# Patient Record
Sex: Female | Born: 1935 | Race: White | Hispanic: No | Smoking: Never smoker
Health system: Southern US, Community
[De-identification: ages and names within clinical notes are randomized; demographics above are authoritative.]

---

## 1997-12-29 ENCOUNTER — Other Ambulatory Visit: Admission: RE | Admit: 1997-12-29 | Discharge: 1997-12-29 | Payer: Self-pay | Admitting: Family Medicine

## 2000-03-26 ENCOUNTER — Encounter: Payer: Self-pay | Admitting: Family Medicine

## 2000-03-26 ENCOUNTER — Encounter: Admission: RE | Admit: 2000-03-26 | Discharge: 2000-03-26 | Payer: Self-pay | Admitting: Family Medicine

## 2000-03-30 ENCOUNTER — Encounter: Payer: Self-pay | Admitting: Family Medicine

## 2000-03-30 ENCOUNTER — Encounter: Admission: RE | Admit: 2000-03-30 | Discharge: 2000-03-30 | Payer: Self-pay | Admitting: Family Medicine

## 2001-04-04 ENCOUNTER — Encounter: Admission: RE | Admit: 2001-04-04 | Discharge: 2001-04-04 | Payer: Self-pay | Admitting: Family Medicine

## 2001-04-04 ENCOUNTER — Encounter: Payer: Self-pay | Admitting: Family Medicine

## 2002-04-08 ENCOUNTER — Encounter: Payer: Self-pay | Admitting: Family Medicine

## 2002-04-08 ENCOUNTER — Encounter: Admission: RE | Admit: 2002-04-08 | Discharge: 2002-04-08 | Payer: Self-pay | Admitting: Family Medicine

## 2003-04-17 ENCOUNTER — Encounter: Admission: RE | Admit: 2003-04-17 | Discharge: 2003-04-17 | Payer: Self-pay | Admitting: Family Medicine

## 2003-04-17 ENCOUNTER — Encounter: Payer: Self-pay | Admitting: Family Medicine

## 2004-05-04 ENCOUNTER — Encounter: Admission: RE | Admit: 2004-05-04 | Discharge: 2004-05-04 | Payer: Self-pay | Admitting: Family Medicine

## 2005-05-16 ENCOUNTER — Encounter: Admission: RE | Admit: 2005-05-16 | Discharge: 2005-05-16 | Payer: Self-pay | Admitting: Family Medicine

## 2006-05-17 ENCOUNTER — Encounter: Admission: RE | Admit: 2006-05-17 | Discharge: 2006-05-17 | Payer: Self-pay | Admitting: Family Medicine

## 2009-12-14 ENCOUNTER — Emergency Department (HOSPITAL_COMMUNITY)
Admission: EM | Admit: 2009-12-14 | Discharge: 2009-12-14 | Payer: Self-pay | Source: Home / Self Care | Admitting: Emergency Medicine

## 2010-11-08 LAB — COMPREHENSIVE METABOLIC PANEL
ALT: 15 U/L (ref 0–35)
AST: 26 U/L (ref 0–37)
BUN: 8 mg/dL (ref 6–23)
Calcium: 10.3 mg/dL (ref 8.4–10.5)
Chloride: 103 mEq/L (ref 96–112)
Potassium: 3.9 mEq/L (ref 3.5–5.1)
Sodium: 137 mEq/L (ref 135–145)

## 2010-11-08 LAB — URINALYSIS, ROUTINE W REFLEX MICROSCOPIC
Glucose, UA: NEGATIVE mg/dL
Hgb urine dipstick: NEGATIVE
Nitrite: NEGATIVE
Protein, ur: NEGATIVE mg/dL
pH: 8 (ref 5.0–8.0)

## 2010-11-08 LAB — URINE CULTURE

## 2010-11-08 LAB — CBC
MCHC: 34.2 g/dL (ref 30.0–36.0)
MCV: 97.2 fL (ref 78.0–100.0)
RBC: 3.62 MIL/uL — ABNORMAL LOW (ref 3.87–5.11)
WBC: 11.4 10*3/uL — ABNORMAL HIGH (ref 4.0–10.5)

## 2010-11-08 LAB — URINE MICROSCOPIC-ADD ON

## 2010-11-08 LAB — DIFFERENTIAL
Lymphs Abs: 1.1 10*3/uL (ref 0.7–4.0)
Neutrophils Relative %: 85 % — ABNORMAL HIGH (ref 43–77)

## 2010-11-08 LAB — TROPONIN I: Troponin I: 0.03 ng/mL (ref 0.00–0.06)

## 2010-11-08 LAB — CK TOTAL AND CKMB (NOT AT ARMC): Total CK: 124 U/L (ref 7–177)

## 2016-06-21 ENCOUNTER — Encounter (HOSPITAL_COMMUNITY): Payer: Self-pay | Admitting: *Deleted

## 2016-06-21 DIAGNOSIS — M62838 Other muscle spasm: Secondary | ICD-10-CM | POA: Insufficient documentation

## 2016-06-21 DIAGNOSIS — I10 Essential (primary) hypertension: Secondary | ICD-10-CM | POA: Diagnosis not present

## 2016-06-21 DIAGNOSIS — M542 Cervicalgia: Secondary | ICD-10-CM | POA: Diagnosis present

## 2016-06-21 MED ORDER — OXYCODONE-ACETAMINOPHEN 5-325 MG PO TABS
1.0000 | ORAL_TABLET | ORAL | Status: DC | PRN
  Administered 2016-06-21: 1 via ORAL
  Filled 2016-06-21: qty 1

## 2016-06-21 NOTE — ED Triage Notes (Signed)
Pt complains of right neck pain radiating to down her arm  for the past 8 hours. Pt states pain is worse when trying to turn neck or lift arm.

## 2016-06-21 NOTE — ED Notes (Signed)
Pain medication given in Triage. Patient advised about side effects of medications and  to avoid driving for a minimum of 4 hours.  

## 2016-06-22 ENCOUNTER — Emergency Department (HOSPITAL_COMMUNITY)
Admission: EM | Admit: 2016-06-22 | Discharge: 2016-06-22 | Disposition: A | Discharge status: Home / Self Care | Payer: Medicare Other | Attending: Emergency Medicine | Admitting: Emergency Medicine

## 2016-06-22 ENCOUNTER — Emergency Department (HOSPITAL_COMMUNITY): Payer: Medicare Other

## 2016-06-22 DIAGNOSIS — M62838 Other muscle spasm: Secondary | ICD-10-CM

## 2016-06-22 DIAGNOSIS — M542 Cervicalgia: Secondary | ICD-10-CM

## 2016-06-22 MED ORDER — DICLOFENAC SODIUM 1 % TD GEL: 2.0000 g | Freq: Four times a day (QID) | TRANSDERMAL | 0 refills | Status: DC

## 2016-06-22 MED ORDER — TRAMADOL HCL 50 MG PO TABS: 50.0000 mg | ORAL_TABLET | Freq: Four times a day (QID) | ORAL | 0 refills | Status: DC | PRN

## 2016-06-22 MED ORDER — NAPROXEN 500 MG PO TABS
500.0000 mg | ORAL_TABLET | Freq: Once | ORAL | Status: AC
  Administered 2016-06-22: 500 mg via ORAL
  Filled 2016-06-22: qty 1

## 2016-06-22 MED ORDER — METHOCARBAMOL 1000 MG/10ML IJ SOLN: 1000.0000 mg | Freq: Once | INTRAMUSCULAR | Status: DC

## 2016-06-22 MED ORDER — METHOCARBAMOL 500 MG PO TABS
1000.0000 mg | ORAL_TABLET | Freq: Once | ORAL | Status: AC
  Administered 2016-06-22: 1000 mg via ORAL
  Filled 2016-06-22: qty 2

## 2016-06-22 NOTE — ED Provider Notes (Signed)
[facility]-EMERGENCY DEPT Provider Note   CSN: [id number] Arrival date & time: 06/21/16  2102 By signing my name below, I, Levon Hedger, attest that this documentation has been prepared under the direction and in the presence of non-physician practitioner, Arvilla Meres, PA-C  Electronically Signed: Levon Hedger, Scribe. 06/22/2016. 1:50 AM.   History   Chief Complaint Chief Complaint  Patient presents with  . Neck Pain   HPI Theresa Day is a 82 y.o. female with hx of arthritis and HTN who presents to the Emergency Department complaining of gradually worsening, right-sided neck pain. She states she was turning her head when the pain began. She describes the pain as a "soreness." Pain is exacerbated by movement. Pt denies any trauma or injury. Pt applied arthritis cream and took aspirin PTA with no relief. She was given percocet in the ED with some relief. Pt rates her pain as 10/10 in severity upon arrival at the ED, but now states it is tolerable and moderate.  No recent change in activity. No recent travel, surgeries, or hospitalizations. No hx of cancer. Pt denies any rash, difficulty swallowing, blurred vision, CP, SOB, abdominal pain, photophobia, fever, vomiting, hematuria, incontinence, dysuria, dizziness, lightheadedness, weakness, or numbness.    The history is provided by the patient. No language interpreter was used.    History reviewed. No pertinent past medical history.  There are no active problems to display for this patient.   History reviewed. No pertinent surgical history.  OB History    No data available      Home Medications    Prior to Admission medications   Medication Sig Start Date End Date Taking? Authorizing Provider  diclofenac sodium (VOLTAREN) 1 % GEL Apply 2 g topically 4 (four) times daily. 06/22/16   Lona Kettle, PA-C  traMADol (ULTRAM) 50 MG tablet Take 1 tablet (50 mg total) by mouth every 6 (six) hours as needed for severe pain. 06/22/16    Lona Kettle, PA-C   Family History No family history on file.  Social History Social History  Substance Use Topics  . Smoking status: Never Smoker  . Smokeless tobacco: Never Used  . Alcohol use No     Allergies   Review of patient's allergies indicates not on file.   Review of Systems Review of Systems  Constitutional: Negative for chills, diaphoresis and fever.  HENT: Negative for trouble swallowing.   Eyes: Negative for photophobia and visual disturbance.  Respiratory: Negative for shortness of breath.   Cardiovascular: Negative for chest pain.  Gastrointestinal: Negative for abdominal pain, blood in stool, constipation, diarrhea, nausea and vomiting.  Genitourinary: Negative for dysuria and hematuria.  Musculoskeletal: Positive for myalgias and neck pain.  Skin: Negative for rash.  Neurological: Negative for weakness, light-headedness, numbness and headaches.    Physical Exam Updated Vital Signs BP 169/78 (BP Location: Left Arm)   Pulse 77   Temp 97.8 F (36.6 C) (Oral)   Resp 18   SpO2 100%   Physical Exam  Constitutional: She appears well-developed and well-nourished. No distress.  HENT:  Head: Normocephalic and atraumatic.  Mouth/Throat: Oropharynx is clear and moist. No oropharyngeal exudate.  Eyes: Conjunctivae and EOM are normal. Pupils are equal, round, and reactive to light. Right eye exhibits no discharge. Left eye exhibits no discharge. No scleral icterus.  Neck: Normal range of motion and phonation normal. Neck supple. Spinous process tenderness and muscular tenderness present. No neck rigidity. Normal range of motion present.  Mild cervical spinal  tenderness. TTP of right trapezius.   Cardiovascular: Normal rate, regular rhythm, normal heart sounds and intact distal pulses.   No murmur heard. No JVD. No carotid bruits.  Pulmonary/Chest: Effort normal and breath sounds normal. No stridor. No respiratory distress. She has no wheezes. She has  no rales.  Abdominal: Soft. Bowel sounds are normal. She exhibits no distension. There is no tenderness. There is no rigidity, no rebound, no guarding and no CVA tenderness.  Musculoskeletal: Normal range of motion.  Lymphadenopathy:    She has no cervical adenopathy.  Neurological: She is alert. She is not disoriented. Coordination and gait normal. GCS eye subscore is 4. GCS verbal subscore is 5. GCS motor subscore is 6.  Mental Status:  Alert, thought content appropriate, able to give a coherent history. Speech fluent without evidence of aphasia. Able to follow 2 step commands without difficulty.  Cranial Nerves:  II: pupils equal, round, reactive to light III,IV, VI: ptosis not present, extra-ocular motions intact bilaterally  V,VII: smile symmetric, facial light touch sensation equal VIII: hearing grossly normal to voice  X: uvula elevates symmetrically  XI: bilateral shoulder shrug symmetric and strong XII: midline tongue extension without fassiculations Motor:  Normal tone. 5/5 in upper and lower extremities bilaterally including strong and equal grip strength and dorsiflexion/plantar flexion Sensory: light touch normal in all extremities. Cerebellar: normal finger-to-nose with bilateral upper extremities CV: distal pulses palpable throughout   Skin: Skin is warm and dry. She is not diaphoretic.  Psychiatric: She has a normal mood and affect. Her behavior is normal.   ED Treatments / Results  DIAGNOSTIC STUDIES:  Oxygen Saturation is 100% on RA, normal by my interpretation.    COORDINATION OF CARE:  1:46 AM Discussed treatment plan with pt at bedside and pt agreed to plan.  Labs (all labs ordered are listed, but only abnormal results are displayed) Labs Reviewed - No data to display  EKG  EKG Interpretation None       Radiology Dg Cervical Spine Complete  Result Date: 06/22/2016 CLINICAL DATA:  Nontraumatic right sided neck pain extending into the upper extremity  for 48 hours. EXAM: CERVICAL SPINE - COMPLETE 4+ VIEW COMPARISON:  None. FINDINGS: There is no fracture. There is no bone lesion or bony destruction. Moderately severe degenerative cervical disc disease is present, greatest at C4-5 and C5-6. There is mild degenerative retrolisthesis of C4 with respect to C3 and C5. There is osteophytic encroachment on the neural foramina bilaterally, C4 through C6. IMPRESSION: Degenerative cervical disc disease.  No acute findings are evident. Electronically Signed   By: Ellery Plunkaniel R Mitchell M.D.   On: 06/22/2016 02:59    Procedures Procedures (including critical care time)  Medications Ordered in ED Medications  oxyCODONE-acetaminophen (PERCOCET/ROXICET) 5-325 MG per tablet 1 tablet (1 tablet Oral Given 06/21/16 2128)  methocarbamol (ROBAXIN) tablet 1,000 mg (not administered)  naproxen (NAPROSYN) tablet 500 mg (500 mg Oral Given 06/22/16 0305)     Initial Impression / Assessment and Plan / ED Course  I have reviewed the triage vital signs and the nursing notes.  Pertinent labs & imaging results that were available during my care of the patient were reviewed by me and considered in my medical decision making (see chart for details).  Clinical Course  Value Comment By Time  DG Cervical Spine Complete Degenerative disc disease noted.  Lona Kettleshley Laurel Meyer, [place]C 11/02 [phone]    Patient presents to ED with complaint of right sided neck pain x 1 day. Patient  is afebrile and non-toxic appearing in NAD. Vital signs remarkable for HTN, otherwise stable. Reproducible TTP of right trapezius, mild TTP of cervical spine. Neck ROM intact. No JVD. No carotid bruit. Patient denies CP or SOB. Lungs CTABL. Heart RRR. No focal neuro deficits on exam. X-ray remarkable for cervical DDD, no fracture. Pain improved in ED with treatment. Low suspicion of cardiac etiology. Suspect MSK in nature as pain is reproducible with TTP, worse with movement.  Discussed results and plan with patient.  Symptomatic management discussed - heat/ice, gentle stretching, massage, tylenol. Rx voltaren gel and ultram for severe pain. Follow up with PCP next week. Return precautions given. Pt voiced understanding and is agreeable.   Discussed patient with Dr. Nicanor AlconPalumbo, who also saw patient, agrees with plan.   Final Clinical Impressions(s) / ED Diagnoses   Final diagnoses:  Neck pain  Trapezius muscle spasm   New Prescriptions New Prescriptions   DICLOFENAC SODIUM (VOLTAREN) 1 % GEL    Apply 2 g topically 4 (four) times daily.   TRAMADOL (ULTRAM) 50 MG TABLET    Take 1 tablet (50 mg total) by mouth every 6 (six) hours as needed for severe pain.  I personally performed the services described in this documentation, which was scribed in my presence. The recorded information has been reviewed and is accurate.    Lona Kettleshley Laurel Meyer, [place]-C 06/22/16 [phone]    April Palumbo, MD 06/22/16 [phone]

## 2016-06-22 NOTE — Discharge Instructions (Signed)
Read the information below.  Your x-ray shows arthritis in your neck. I suspect you have a tight muscle. You are being prescribed voltaren gel. You can apply to the right shoulder for symptomatic relief. You can take tylenol 650mg  every 6hrs for mild to moderate pain. I have prescribed ultram for severe pain. This can make you drowsy, take this medication with caution.  Use the prescribed medication as directed.  Please discuss all new medications with your pharmacist.   Be sure to call your primary provider and follow up on Monday for re-evaluation.  You may return to the Emergency Department at any time for worsening condition or any new symptoms that concern you. Return to ED if you develop fever, numbness, weakness, chest pain, shortness of breath, or any other new/concerning symptoms.

## 2021-06-29 NOTE — Progress Notes (Signed)
 Medicare Subsequent AWV   Theresa Day is a 87 y.o. female who presents for her subsequent annual wellness visit for Medicare.  Any physical exam components or additional concerns beyond the scope of the Annual Wellness Visit may be documented in a separate note within this encounter.  Health Risk Assessment   Current Living Arrangement: Spouse/Significant Other During the past four weeks, how much pain in your body have you had on a scale of 0-10?: No pain (0) During the past four weeks, was someone available to help you if you needed and wanted help?: No assistance needed During the past four weeks, what was the hardest level of physical activity you could do for at least two minutes?: Heavy Each night, how many hours of sleep do you usually get?: 8 Do you snore or has anyone told you that you snore?: No Do you always fasten your seat belt when you are in a car?: Yes Do you drive after drinking alcohol or ride with a driver who has been drinking?: No How often during the past four weeks have you been bothered by sexual problems?: Never Do any of the following describe you?  Multiple sex partners and/or intercourse with partner of the same sex: No How often during the past four weeks have you been bothered by teeth or denture problems?: Never How often during the past four weeks have you been bothered by tiredness or fatigue?: Never During the past four weeks, how would you rate your health in general?: Very good What is your race? (Check all that apply): White   Substance Use Disorder and Opioid Risk Screening   Any identifiable risks for substance use disorder (illegal or inappropriate use of a controlled substance)?  Please select any that are applicable.: No identifiable risks Is patient currently using an opioid agonist medication routinely or PRN?: No   Depression Screening      06/29/2021    2:05 PM 10/03/2019   10:06 AM 07/24/2018    1:32 PM  Depression Screen   Please choose the category that best describes the patient's current state 0 0 0  Not eligible on the basis of Not applicable Not applicable Not applicable  1. Little interest or pleasure in doing things 0 0 0  2. Feeling down, depressed, or hopeless 0 0 0  PHQ-2 Total Score 0 0 0  PHQ-2 Interpretation of Score for Depression (Payor) Negative Negative       Cognitive and Functional Assessments   Is the person deaf or does he/she have serious difficulty hearing?: No Is this person blind or does he/she have serious difficulty seeing even when wearing glasses?: No Do you/patient have serious difficulty concentrating, remembering, or making decisions?: No Have you had any concerns about changes in your memory or concentration?: No  ADL Assessment   Please select any of the following that the person has serious difficulty managing on their own:: none apply Please select any of the following that the person has serious difficulty managing on their own:: none apply   Social Determinants of Health (SDoH) and Personal Data   On average, how many days per week do you engage in moderate to strenuous exercise (like a brisk walk)?: 0 days On average, how many minutes do you engage in exercise at this level?: 0 min How hard is it for you to pay for the very basics like food, housing, medical care, and heating?: Not hard at all In the last 12 months, was there  a time when you were not able to pay the mortgage or rent on time?: No In the last 12 months, how many places have you lived?: 1 In the last 12 months, was there a time when you did not have a steady place to sleep or slept in a shelter (including now)?: No In the past 12 months, has lack of transportation kept you from medical appointments or from getting medications?: No In the past 12 months, has lack of transportation kept you from meetings, work, or from getting things needed for daily living?: No Within the past 12 months, you worried  that your food would run out before you got the money to buy more.: Never true Within the past 12 months, the food you bought just didn't last and you didn't have money to get more.: Never true Do you feel stress - tense, restless, nervous, or anxious, or unable to sleep at night because your mind is troubled all the time - these days?: Not at all In a typical week, how many times do you talk on the phone with family, friends, or neighbors?: More than three times a week How often do you get together with friends or relatives?: Once a week How often do you attend church or religious services?: Never Do you belong to any clubs or organizations such as church groups, unions, fraternal or athletic groups, or school groups?: No How often do you attend meetings of the clubs or organizations you belong to?: Never Are you married, widowed, divorced, separated, never married, or living with a partner?: Married Q1: How often do you have a drink containing alcohol?: 2-4 times a month Q2: How many drinks containing alcohol do you have on a typical day when you are drinking?: 1 or 2 Q3: How often do you have six or more drinks on one occasion?: Never Do you depend on people living with you for personal care?: No Are there any cultural or religious beliefs that your healthcare provider should be aware of that would be helpful in your health care? : No  Advance Care Directives   Do you have a living will?: Yes Do you have a Healthcare Power of Attorney?: (!) No  Falls Risk Screening      Patient can ambulate: Yes Do you feel unsteady when standing or walking?: No Do you worry about falling?: No Have you fallen in the past year?: No  Medicare Required Components     Reviewed and updated this visit by provider: Tobacco  Allergies  Meds  Problems  Med Hx  Surg Hx  Fam Hx        Cognitive screen indicated?: No Based on my direct observation, with due consideration of information obtained via  beneficiary reports and communication with family members/care takers, further cognitive assessment is not indicated.  Dietary issues addressed:: Yes HRA completed and reviewed:: Yes Care Team updated:: Yes Advance care directives discussed and information provided if necessary:: Yes  Patient Care Team: Rosina LITTIE Bullock, PA-C as PCP - General (Physician Assistant)  Vitals    Vitals:   06/29/21 1355 06/29/21 1405  BP: (!) 177/85 140/68  Patient Position: Sitting   Pulse: 74   Temp: 97.3 F (36.3 C)   TempSrc: Temporal   Resp: 18   Height: 5' 1 (1.549 m)   Weight: 108 lb 12.8 oz (49.4 kg)   SpO2: 95%   BMI (Calculated): 20.6   PainSc: 0-No pain     Disposition   1. Encounter  for annual wellness exam in Medicare patient (Primary) 2. Encounter for wellness examination -     CBC And Differential; Future -     Comprehensive Metabolic Panel; Future -     Vitamin D 25 Hydroxy; Future -     Lipid Panel With LDL/HDL Ratio; Future -     TSH; Future 3. Essential hypertension -     CBC And Differential; Future 4. Mixed hyperlipidemia -     Lipid Panel With LDL/HDL Ratio; Future 5. Vitamin D deficiency -     Vitamin D 25 Hydroxy; Future 6. Needs flu shot 7. Need for vaccination -     Fluad Adjuvanted Quad (age 58 and older) 0.56mL - prefilled syringe    No follow-ups on file.   Health maintenance issues including appropriate cancer screening, annual eye exam, healthy diet, exercise and tobacco avoidance were discussed with the patient.  A written plan was provided to the patient in the form of patient instructions in the after visit summary.

## 2021-07-01 NOTE — Progress Notes (Signed)
 Subjective   Theresa Day is a 87 y.o. female who presents for an annual exam.    Health Maintenance: Last wellness visit:  more than 1 year ago Diet:  general Calcium supplementation:  never Vitamin D supplementation:  never Exercise frequency:  never Exercise type:  no regular exercise Pap: patient does not recall results of last pap Mammogram:  was normal DEXA:  Unknown Colonoscopy:  Yes  Patient Active Problem List  Diagnosis  . Vertigo  . Essential hypertension  . Mixed hyperlipidemia  . Vitamin D deficiency   Outpatient Medications Marked as Taking for the 06/29/21 encounter (Medicare AWV) with Tinnie Kohl, FNP  Medication Sig Dispense Refill  . lisinopril (PRINIVIL,ZESTRIL) 40 mg tablet TAKE ONE TABLET (40 MG DOSE) BY MOUTH DAILY. 90 tablet 3   Allergies Patient is allergic to lipitor [atorvastatin], zetia [ezetimibe], zocor [simvastatin], and pravachol.  Review of Systems - All other systems were reviewed and are negative unless stated in HPI.  Family History  Problem Relation Age of Onset  . Other Other        Family history of No Significant Family History  . Diabetes Daughter   . No Known Problems Mother   . No Known Problems Father    Past Medical History:  Diagnosis Date  . Cataract    bilateral  . Essential hypertension, benign    History of Benign Essential Hypertension  . Essential hypertension, benign    History of Benign Essential Hypertension  . Hyperlipidemia   . Vertigo    Past Surgical History:  Procedure Laterality Date  . Eye surgery     cataracts bilateral  . Tubal ligation     Pediatric History  Patient Parents  . Not on file   Other Topics Concern  . Not on file  Social History Narrative   Marital History - Currently Married   Never Drank Alcohol    has a past surgical history that includes Eye surgery and Tubal ligation.  Objective   BP 140/68 (BP Location: Left arm)   Pulse 74   Temp 97.3 F (36.3 C)  (Temporal)   Resp 18   Ht 5' 1 (1.549 m)   Wt 108 lb 12.8 oz (49.4 kg)   SpO2 95%   BMI 20.56 kg/m   General: The patient is a 87 y.o. female who appears to be in no acute distress. Psych: She is alert and oriented to person, place, and time. Her mood and affect are normal. HEENT: Normocephalic, atraumatic, non-icteric sclera, PERRL.  Tympanic membranes are without perforation or infection.  Nasopharynx is grossly normal.  Oropharynx is without mass or exudate. Neck:  Supple.  Trachea is in midline position.  The neck is without adenopathy, masses, or thyromegaly. Lungs:  Good breath sounds are noted bilaterally.  The lungs are clear to auscultation and percussion bilaterally.  The spine and CVA region are  nontender to palpation. Cardio:  Regular rate and rhythm without gallop, rub, or murmur.  Abdomen:  Bowel sounds are physiologic.  The abdomen is soft and nontender to palpation.  No masses are noted.  No hepatosplenomegaly is noted.  Skin:  No rashes or worrisome lesions are noted.  Extremities:  The extremities are without cyanosis or significant contusions.  Pitting edema is not noted.  ROM is normal in all four extremities. Pulses: Adequate pulses are noted in all four extremities and both carotid arteries. Neuro:  Mental status is normal.  CN 2-11 are grossly normal.  Motor and  sensory exams are grossly normal.  DTR are physiologic in all extremities.  Gait is stable. Breast Exam:  Deferred Pelvic:  Deferred   Impression     ICD-10-CM   1. Encounter for annual wellness exam in Medicare patient  Z00.00     2. Encounter for wellness examination  Z00.00 CBC And Differential    Comprehensive Metabolic Panel    Vitamin D 25 Hydroxy    Lipid Panel With LDL/HDL Ratio    TSH    TSH    Lipid Panel With LDL/HDL Ratio    Vitamin D 25 Hydroxy    Comprehensive Metabolic Panel    CBC And Differential    3. Essential hypertension  I10 CBC And Differential    CBC And Differential     4. Mixed hyperlipidemia  E78.2 Lipid Panel With LDL/HDL Ratio    Lipid Panel With LDL/HDL Ratio    5. Vitamin D deficiency  E55.9 Vitamin D 25 Hydroxy    Vitamin D 25 Hydroxy    6. Need for vaccination  Z23 Fluad Adjuvanted Quad (age 21 and older) 0.49mL - prefilled syringe      Plan    Orders Placed This Encounter  Procedures  . Fluad Adjuvanted Quad (age 22 and older) 0.5mL - prefilled syringe  . CBC And Differential  . Comprehensive Metabolic Panel  . Vitamin D 25 Hydroxy  . Lipid Panel With LDL/HDL Ratio  . TSH    - Health maintenance issues including appropriate cancer screening, healthy diet, exercise and tobacco avoidance were discussed with the patient.  I've encouraged healthy lifestyle modifications of eating fruits/vegetables, decreased fat intake, regular daily exercise, and decrease stress.  - Risks, benefits, and alternatives of the medications and treatment plan prescribed today were discussed, and patient expressed understanding.  - Pap smear and breast exam are done at her ob/gyn.  - Labs ordered today: cbc/d,cmp,tsh,vitamin d,lipid panel.  We will call pt with results. - No follow-ups on file. - Return to clinic to be reevaluated if symptoms worsen, persist, change, or if you have any other concerns. - I discussed this diagnosis with the patient and discussed the treatment plan with them. This treatment plan is also outlined in the Patient Instructions and a copy of this was provided to the patient.

## 2021-08-18 ENCOUNTER — Encounter (HOSPITAL_BASED_OUTPATIENT_CLINIC_OR_DEPARTMENT_OTHER): Payer: Self-pay | Admitting: *Deleted

## 2021-08-18 ENCOUNTER — Emergency Department (HOSPITAL_BASED_OUTPATIENT_CLINIC_OR_DEPARTMENT_OTHER): Payer: Medicare Other

## 2021-08-18 ENCOUNTER — Other Ambulatory Visit: Payer: Self-pay

## 2021-08-18 ENCOUNTER — Emergency Department (HOSPITAL_BASED_OUTPATIENT_CLINIC_OR_DEPARTMENT_OTHER)
Admission: EM | Admit: 2021-08-18 | Discharge: 2021-08-18 | Disposition: A | Discharge status: Home / Self Care | Payer: Medicare Other | Attending: Emergency Medicine | Admitting: Emergency Medicine

## 2021-08-18 DIAGNOSIS — M545 Low back pain, unspecified: Secondary | ICD-10-CM | POA: Diagnosis not present

## 2021-08-18 DIAGNOSIS — S52502A Unspecified fracture of the lower end of left radius, initial encounter for closed fracture: Secondary | ICD-10-CM | POA: Diagnosis not present

## 2021-08-18 DIAGNOSIS — M25562 Pain in left knee: Secondary | ICD-10-CM | POA: Insufficient documentation

## 2021-08-18 DIAGNOSIS — W010XXA Fall on same level from slipping, tripping and stumbling without subsequent striking against object, initial encounter: Secondary | ICD-10-CM | POA: Diagnosis not present

## 2021-08-18 DIAGNOSIS — W19XXXA Unspecified fall, initial encounter: Secondary | ICD-10-CM

## 2021-08-18 DIAGNOSIS — S0990XA Unspecified injury of head, initial encounter: Secondary | ICD-10-CM | POA: Diagnosis not present

## 2021-08-18 DIAGNOSIS — S6992XA Unspecified injury of left wrist, hand and finger(s), initial encounter: Secondary | ICD-10-CM | POA: Diagnosis present

## 2021-08-18 DIAGNOSIS — S62102A Fracture of unspecified carpal bone, left wrist, initial encounter for closed fracture: Secondary | ICD-10-CM

## 2021-08-18 MED ORDER — TRAMADOL HCL 50 MG PO TABS: 50.0000 mg | ORAL_TABLET | Freq: Four times a day (QID) | ORAL | 0 refills | Status: DC | PRN

## 2021-08-18 NOTE — ED Triage Notes (Signed)
Mechanical fall x 8 hrs ago , c/o left knee and leg pain and left hand and wrist pain

## 2021-08-18 NOTE — Discharge Instructions (Signed)
Follow-up with your primary care doctor within the week.  Also follow-up with orthopedic surgery given you have a fracture of your left wrist.  Keep the splint in place.  Take Tylenol as needed for pain.  Return to the ER if you have fevers new symptoms vomiting cough diarrhea increased pain or any additional concerns.

## 2021-08-18 NOTE — ED Provider Notes (Signed)
[facility] EMERGENCY DEPARTMENT Provider Note   CSN: [id number] Arrival date & time: 08/18/21  1601     History Chief Complaint  Patient presents with   Marletta Lor    Theresa Day is a 87 y.o. female.  Patient presents to ER chief complaint of left wrist pain and left knee pain.  She states that she lost her balance and fell.  She has multiple nephews and family numbers who have been taking turns watching over her.  She typically walks with a walker.  However she states that her husband also has difficulty walking and they bumped into each other and she fell.  Denies head injury loss consciousness.  Complaining of lower back pain as well but states that this has been chronic for her.  Denies fevers or cough vomiting diarrhea no chest pain or shortness of breath reported.      History reviewed. No pertinent past medical history.  There are no problems to display for this patient.   History reviewed. No pertinent surgical history.   OB History   No obstetric history on file.     No family history on file.  Social History   Tobacco Use   Smoking status: Never   Smokeless tobacco: Never  Substance Use Topics   Alcohol use: No    Home Medications Prior to Admission medications   Medication Sig Start Date End Date Taking? Authorizing Provider  diclofenac sodium (VOLTAREN) 1 % GEL Apply 2 g topically 4 (four) times daily. 06/22/16   Deborha Payment, PA-C  traMADol (ULTRAM) 50 MG tablet Take 1 tablet (50 mg total) by mouth every 6 (six) hours as needed for severe pain. 06/22/16   Deborha Payment, PA-C    Allergies    Atorvastatin, Ezetimibe, Simvastatin, and Statins  Review of Systems   Review of Systems  Constitutional:  Negative for fever.  HENT:  Negative for ear pain.   Eyes:  Negative for pain.  Respiratory:  Negative for cough.   Cardiovascular:  Negative for chest pain.  Gastrointestinal:  Negative for abdominal pain.  Genitourinary:  Negative for  flank pain.  Musculoskeletal:  Negative for back pain.  Skin:  Negative for rash.  Neurological:  Negative for headaches.   Physical Exam Updated Vital Signs BP (!) 135/51    Pulse 80    Temp 98.5 F (36.9 C)    Resp 15    Ht 4\' 2"  (1.27 m)    Wt 56.7 kg    SpO2 99%    BMI 35.15 kg/m   Physical Exam Constitutional:      General: She is not in acute distress.    Appearance: Normal appearance.  HENT:     Head: Normocephalic.     Nose: Nose normal.  Eyes:     Extraocular Movements: Extraocular movements intact.  Cardiovascular:     Rate and Rhythm: Normal rate.  Pulmonary:     Effort: Pulmonary effort is normal.  Musculoskeletal:     Cervical back: Normal range of motion.     Comments: Swelling deformity tenderness to the left wrist.  Neurovascular intact otherwise.  Compartments are soft.  Pain with attempted range of motion left knee no gross deformity noted neurovascular intact distally compartments soft.  Neurological:     General: No focal deficit present.     Mental Status: She is alert and oriented to person, place, and time. Mental status is at baseline.     Cranial Nerves: No cranial  nerve deficit.     Motor: No weakness.    ED Results / Procedures / Treatments   Labs (all labs ordered are listed, but only abnormal results are displayed) Labs Reviewed - No data to display  EKG None  Radiology DG Wrist Complete Left  Result Date: 08/18/2021 CLINICAL DATA:  Trauma, pain EXAM: LEFT WRIST - COMPLETE 3+ VIEW COMPARISON:  None. FINDINGS: There is comminuted fracture in the distal metaphysis of radius. There is break in the dorsal cortical margin. Undisplaced fracture is seen in the ulnar styloid. Osteopenia is seen in the bony structures. IMPRESSION: Comminuted fracture is seen in the distal metaphysis of left radius with overriding of fracture fragments along the dorsal cortical margin. Undisplaced fracture is seen in the ulnar styloid process. Electronically Signed    By: Ernie Avena M.D.   On: 08/18/2021 17:13   CT Head Wo Contrast  Result Date: 08/18/2021 CLINICAL DATA:  Larey Seat 8 hours ago, left-sided pain EXAM: CT HEAD WITHOUT CONTRAST TECHNIQUE: Contiguous axial images were obtained from the base of the skull through the vertex without intravenous contrast. COMPARISON:  None. FINDINGS: Brain: Scattered hypodensities throughout the periventricular and subcortical white matter are most consistent with chronic small vessel ischemic changes. No signs of acute infarct or hemorrhage. The lateral ventricles and midline structures are unremarkable. No acute extra-axial fluid collections. No mass effect. Vascular: No hyperdense vessel or unexpected calcification. Skull: Normal. Negative for fracture or focal lesion. Sinuses/Orbits: No acute finding. Other: None. IMPRESSION: 1. No acute intracranial process. Electronically Signed   By: Sharlet Salina M.D.   On: 08/18/2021 19:54   CT Cervical Spine Wo Contrast  Result Date: 08/18/2021 CLINICAL DATA:  Larey Seat 8 hours ago, left-sided pain EXAM: CT CERVICAL SPINE WITHOUT CONTRAST TECHNIQUE: Multidetector CT imaging of the cervical spine was performed without intravenous contrast. Multiplanar CT image reconstructions were also generated. COMPARISON:  None. FINDINGS: Alignment: Mild retrolisthesis of C4 relative to C5 due to prominent spondylosis. Left convex scoliosis. Skull base and vertebrae: No acute fracture. No primary bone lesion or focal pathologic process. Soft tissues and spinal canal: No prevertebral fluid or swelling. No visible canal hematoma. Disc levels: There is severe spondylosis at C4-5, C5-6, and C6-7. Diffuse facet hypertrophy. Prominent hypertrophic changes at the C1-C2 interface. Upper chest: Airway is patent.  Lung apices are clear. Other: Reconstructed images demonstrate no additional findings. IMPRESSION: 1. No acute cervical spine fracture. 2. Multilevel cervical spondylosis and facet hypertrophy as  above. Electronically Signed   By: Sharlet Salina M.D.   On: 08/18/2021 19:56   CT Lumbar Spine Wo Contrast  Result Date: 08/18/2021 CLINICAL DATA:  Fall EXAM: CT LUMBAR SPINE WITHOUT CONTRAST TECHNIQUE: Multidetector CT imaging of the lumbar spine was performed without intravenous contrast administration. Multiplanar CT image reconstructions were also generated. COMPARISON:  None. FINDINGS: Segmentation: 5 lumbar type vertebrae. Alignment: Levoscoliosis with apex at L2. Vertebrae: No acute fracture. Paraspinal and other soft tissues: Calcific aortic atherosclerosis. Disc levels: No spinal canal stenosis. IMPRESSION: 1. No acute fracture of the lumbar spine. 2. Levoscoliosis with apex at L2. Aortic Atherosclerosis (ICD10-I70.0). Electronically Signed   By: Deatra Robinson M.D.   On: 08/18/2021 19:56   DG Knee Complete 4 Views Left  Result Date: 08/18/2021 CLINICAL DATA:  Trauma, fall EXAM: LEFT KNEE - COMPLETE 4+ VIEW COMPARISON:  None. FINDINGS: No recent displaced fracture or dislocation is seen. Small bony spurs noted in the medial and patellofemoral compartments. There is no significant effusion in the suprapatellar  bursa. Osteopenia is seen in bony structures. IMPRESSION: No recent fracture or dislocation is seen in the left knee. Electronically Signed   By: Ernie Avena M.D.   On: 08/18/2021 17:15    Procedures .Ortho Injury Treatment  Date/Time: 08/18/2021 8:41 PM Performed by: Cheryll Cockayne, MD Authorized by: Cheryll Cockayne, MD  Post-procedure neurovascular assessment: post-procedure neurovascularly intact Comments: Knee immobilizer to the left knee, left wrist sugar-tong splint placed.  Neurovascular intact after placement.     Medications Ordered in ED Medications - No data to display  ED Course  I have reviewed the triage vital signs and the nursing notes.  Pertinent labs & imaging results that were available during my care of the patient were reviewed by me and  considered in my medical decision making (see chart for details).    MDM Rules/Calculators/A&P                         Multiple imaging studies ordered.  Studies show left distal radius and ulnar styloid fracture.  I discussed the patient's presentation and findings with family members.  We discussed possible hospitalization given advanced age difficulty walking and wrist fracture and inability use a walker now.  However family states that they will take turns watching over her and taking care of her.  They will take her to her orthopedic doctor appointment.  Patient also prefers to go home.  Will be discharged home in stable condition, advised follow-up with her physician and orthopedic surgery within the week. Advised return for worsening symptoms or any additional concerns.     Final Clinical Impression(s) / ED Diagnoses Final diagnoses:  Fall, initial encounter  Closed fracture of left wrist, initial encounter    Rx / DC Orders ED Discharge Orders     None        Cheryll Cockayne, MD 08/18/21 2041

## 2021-08-18 NOTE — ED Notes (Signed)
Patient transported to CT 

## 2021-08-18 NOTE — ED Notes (Signed)
ED Provider at bedside. 

## 2021-11-07 ENCOUNTER — Encounter (HOSPITAL_BASED_OUTPATIENT_CLINIC_OR_DEPARTMENT_OTHER): Payer: Self-pay

## 2021-11-07 ENCOUNTER — Emergency Department (HOSPITAL_BASED_OUTPATIENT_CLINIC_OR_DEPARTMENT_OTHER)
Admission: EM | Admit: 2021-11-07 | Discharge: 2021-11-07 | Disposition: A | Discharge status: Home / Self Care | Payer: Medicare Other | Attending: Emergency Medicine | Admitting: Emergency Medicine

## 2021-11-07 ENCOUNTER — Other Ambulatory Visit: Payer: Self-pay

## 2021-11-07 ENCOUNTER — Emergency Department (HOSPITAL_BASED_OUTPATIENT_CLINIC_OR_DEPARTMENT_OTHER): Payer: Medicare Other

## 2021-11-07 DIAGNOSIS — S4992XA Unspecified injury of left shoulder and upper arm, initial encounter: Secondary | ICD-10-CM | POA: Diagnosis present

## 2021-11-07 DIAGNOSIS — S42222A 2-part displaced fracture of surgical neck of left humerus, initial encounter for closed fracture: Secondary | ICD-10-CM | POA: Insufficient documentation

## 2021-11-07 DIAGNOSIS — W010XXA Fall on same level from slipping, tripping and stumbling without subsequent striking against object, initial encounter: Secondary | ICD-10-CM | POA: Insufficient documentation

## 2021-11-07 MED ORDER — FENTANYL CITRATE PF 50 MCG/ML IJ SOSY
50.0000 ug | PREFILLED_SYRINGE | Freq: Once | INTRAMUSCULAR | Status: AC
  Administered 2021-11-07: 50 ug via INTRAVENOUS
  Filled 2021-11-07: qty 1

## 2021-11-07 MED ORDER — TRAMADOL HCL 50 MG PO TABS: 50.0000 mg | ORAL_TABLET | Freq: Four times a day (QID) | ORAL | 0 refills | Status: DC | PRN

## 2021-11-07 MED ORDER — ONDANSETRON HCL 4 MG/2ML IJ SOLN
4.0000 mg | Freq: Once | INTRAMUSCULAR | Status: AC
Start: 2021-11-07 — End: 2021-11-07
  Administered 2021-11-07: 4 mg via INTRAVENOUS
  Filled 2021-11-07: qty 2

## 2021-11-07 NOTE — ED Notes (Signed)
X-ray at bedside

## 2021-11-07 NOTE — Discharge Instructions (Addendum)
You were seen today after a fall.  Your tramadol will be refilled.  Be careful when using tramadol as this can make you unsteady on your feet or dizzy.  Follow-up with your orthopedist.  Keep your left shoulder immobilized in the meantime. ?

## 2021-11-07 NOTE — ED Triage Notes (Signed)
Pt to ED POV w/grandson. Pt called grandson at approximately 0230 when she had gotten up to go to restroom and had fallen onto floor. Pt has obvious injury to left shoulder and is crying out in pain. Pt had tramadol x 2 prior to arrival.  Pt able to move hand, <3sec cap refill and sensation distal to injury. Pt has brace on left forearm from prior injury.  ?

## 2021-11-07 NOTE — ED Notes (Signed)
ED tech at bedside for sling application. ?

## 2021-11-07 NOTE — ED Provider Notes (Signed)
?[facility] EMERGENCY DEPT ?Provider Note ? ? ?CSN: [id number] ?Arrival date & time: 11/07/21  0347 ? ?  ? ?History ? ?Chief Complaint  ?Patient presents with  ? Fall  ? ? ?Theresa Day is a 88 y.o. female. ? ?HPI ? ?  ? ?This is an 88 year old female who presents following a fall.  Patient reports that she slipped and fell going to the bathroom.  She hit her left shoulder.  She denies hitting her head or loss of consciousness.  She is not on any blood thinners.  She denies hip pain.  She has been ambulatory since that time.  She took 2 tramadol at home before arrival with minimal relief of pain.  Denies numbness or tingling in the hand.  She did recently have a fracture of the left wrist.  Grandson at the bedside states that that was approximately 3 months ago. ? ?Home Medications ?Prior to Admission medications   ?Medication Sig Start Date End Date Taking? Authorizing Provider  ?diclofenac sodium (VOLTAREN) 1 % GEL Apply 2 g topically 4 (four) times daily. 06/22/16   Deborha PaymentMeyer, Ashley L, PA-C  ?traMADol (ULTRAM) 50 MG tablet Take 1 tablet (50 mg total) by mouth every 6 (six) hours as needed for up to 12 doses for severe pain. 08/18/21   Cheryll CockayneHong, Joshua S, MD  ?   ? ?Allergies    ?Atorvastatin, Ezetimibe, Simvastatin, and Statins   ? ?Review of Systems   ?Review of Systems  ?Musculoskeletal:   ?     Shoulder pain  ?All other systems reviewed and are negative. ? ?Physical Exam ?Updated Vital Signs ?BP (!) 165/73 (BP Location: Right Arm)   Pulse 82   Temp (!) 97.3 ?F (36.3 ?C) (Oral)   Resp (!) 22   Ht 1.524 m (5')   Wt 47.6 kg   SpO2 97%   BMI 20.51 kg/m?  ?Physical Exam ?Vitals and nursing note reviewed.  ?Constitutional:   ?   Appearance: She is well-developed. She is not ill-appearing.  ?   Comments: Elderly, frail, nontoxic  ?HENT:  ?   Head: Normocephalic and atraumatic.  ?   Nose: Nose normal.  ?   Mouth/Throat:  ?   Mouth: Mucous membranes are moist.  ?Eyes:  ?   Pupils: Pupils are equal,  round, and reactive to light.  ?Cardiovascular:  ?   Rate and Rhythm: Normal rate and regular rhythm.  ?   Heart sounds: Normal heart sounds.  ?Pulmonary:  ?   Effort: Pulmonary effort is normal. No respiratory distress.  ?Abdominal:  ?   Palpations: Abdomen is soft.  ?Musculoskeletal:  ?   Cervical back: Neck supple.  ?   Comments: Tenderness palpation and swelling noted to the proximal humerus on the left, 2+ radial pulse, minimal range of motion secondary to pain, left wrist splint in place, neurovascular intact distally  ?Skin: ?   General: Skin is warm and dry.  ?Neurological:  ?   Mental Status: She is alert and oriented to person, place, and time.  ?Psychiatric:     ?   Mood and Affect: Mood normal.  ? ? ?ED Results / Procedures / Treatments   ?Labs ?(all labs ordered are listed, but only abnormal results are displayed) ?Labs Reviewed - No data to display ? ?EKG ?None ? ?Radiology ?DG Shoulder Left ? ?Result Date: 11/07/2021 ?CLINICAL DATA:  88 year old female status post fall with left shoulder pain. EXAM: LEFT SHOULDER - 2+ VIEW COMPARISON:  None.  FINDINGS: 3 of these images suggest a minimally displaced and mildly comminuted fracture of the humeral neck, with the humeral head articular surface remaining intact. The 4th image is an exaggerated scapular Y-view, and suggests anterior displacement of the distal fragment up to 8 mm with mild impaction. No glenohumeral joint dislocation suspected. The left clavicle and scapula appear intact. Visible left ribs and chest appear negative. IMPRESSION: Suboptimal exam. Mildly comminuted left humeral neck fracture appears minimally displaced on some views but might be anteriorly displaced up to 8 mm. No glenohumeral dislocation. No left scapula or clavicle fracture. Electronically Signed   By: Odessa Fleming M.D.   On: 11/07/2021 04:42   ? ?Procedures ?Procedures  ? ? ?Medications Ordered in ED ?Medications  ?fentaNYL (SUBLIMAZE) injection 50 mcg (has no administration in  time range)  ?ondansetron [facility]) injection 4 mg (4 mg Intravenous Given 11/07/21 0446)  ? ? ?ED Course/ Medical Decision Making/ A&P ?  ?                        ?Medical Decision Making ?Amount and/or Complexity of Data Reviewed ?Radiology: ordered. ? ?Risk ?Prescription drug management. ? ? ?This patient presents to the ED for concern of left shoulder pain, this involves an extensive number of treatment options, and is a complaint that carries with it a high risk of complications and morbidity.  The differential diagnosis includes fracture, dislocation ? ?MDM:   ? ?Patient presents after reported mechanical fall.  Reports only injury to the left shoulder.  Denies hitting her head or loss of consciousness.  She is nontoxic and vital signs are notable for blood pressure 165/73.  She endorses significant pain.  She has obvious injury to the left shoulder.  She is neurovascularly intact.  X-rays concerning for humeral neck fracture which is mildly displaced.  Patient was placed in a sling.  She has been seeing Dr. Christin Fudge as an outpatient.  Recommend she follow-up with his office.  She was also provided with our on-call provider if she prefers.  Recommend immobilization.  Will refill her tramadol given that she tolerates this with minimal side effects. ?(Labs, imaging) ? ?Labs: ?I Ordered, and personally interpreted labs.  The pertinent results include: None ? ?Imaging Studies ordered: ?I ordered imaging studies including shoulder x-ray shows neck fracture ?I independently visualized and interpreted imaging. ?I agree with the radiologist interpretation ? ?Additional history obtained from grandson.  External records from outside source obtained and reviewed including prior evaluation for wrist fracture ? ?Critical Interventions: ?Sling, IV fentanyl ? ?Consultations: ?I requested consultation with the NA,  and discussed lab and imaging findings as well as pertinent plan - they recommend: N/A ? ?Cardiac Monitoring: ?The  patient was maintained on a cardiac monitor.  I personally viewed and interpreted the cardiac monitored which showed an underlying rhythm of: Sinus ? ?Reevaluation: ?After the interventions noted above, I reevaluated the patient and found that they have :improved ? ? ?Considered admission for: N/A ? ?Social Determinants of Health: ?Lives independently ? ?Disposition: Discharge ? ?Co morbidities that complicate the patient evaluation ?History reviewed. No pertinent past medical history.  ? ?Medicines ?Meds ordered this encounter  ?Medications  ? ondansetron (ZOFRAN) injection 4 mg  ? fentaNYL (SUBLIMAZE) injection 50 mcg  ?  ?I have reviewed the patients home medicines and have made adjustments as needed ? ?Problem List / ED Course: ?Problem List Items Addressed This Visit   ?None ?Visit Diagnoses   ? ?  Closed 2-part displaced fracture of surgical neck of left humerus, initial encounter    -  Primary  ? ?  ?  ? ? ? ? ? ? ? ? ? ? ? ? ?Final Clinical Impression(s) / ED Diagnoses ?Final diagnoses:  ?Closed 2-part displaced fracture of surgical neck of left humerus, initial encounter  ? ? ?Rx / DC Orders ?ED Discharge Orders   ? ? None  ? ?  ? ? ?  ?Shon Baton, MD ?11/07/21 [phone] ? ?

## 2023-05-15 NOTE — Progress Notes (Signed)
  Subjective:    Theresa Day is a 89 y.o. (DOB [birth year]) female.     Patient presents with  . Otalgia    Right ear patient states she just don't feel good dizzy     Theresa Day, brought in by her grandson Theresa Day who helps provide the history, is complaining of right ear pain.  No drainage.  Some Ciprodex was used in her right ear this morning.  She has some mild memory loss and sundowners.  She called Theresa Day at 2 AM in the morning complaining of right ear pain.  No recent fall or head injury.  Otalgia      Reviewed and updated this visit by provider: Tobacco  Allergies  Meds  Problems  Med Hx  Surg Hx  Fam Hx       Review of Systems  HENT:  Positive for ear pain.     Objective:   Vitals:   05/15/23 1535  BP: 144/82  Patient Position: Sitting  Pulse: 103  Temp: 98 F (36.7 C)  TempSrc: Oral  Resp: 16  Height: 5' 1 (1.549 m)  Weight: 87 lb 4.8 oz (39.6 kg)  SpO2: 98%  BMI (Calculated): 16.5  PainSc: 0-No pain   Physical Exam Constitutional:      Appearance: Normal appearance.  HENT:     Head: Normocephalic.     Right Ear: There is impacted cerumen.     Left Ear: There is impacted cerumen.     Ears:     Comments: Right ear canal with red nodule.  Red in appearance.  Obscuring tympanic membrane Cardiovascular:     Rate and Rhythm: Normal rate and regular rhythm.  Pulmonary:     Effort: Pulmonary effort is normal.     Breath sounds: Normal breath sounds.  Skin:    General: Skin is warm.     Coloration: Skin is not jaundiced.  Neurological:     General: No focal deficit present.     Mental Status: She is alert and oriented to person, place, and time.  Psychiatric:        Mood and Affect: Mood normal.        Behavior: Behavior normal.        Thought Content: Thought content normal.        Judgment: Judgment normal.       Assessment / Plan:   Assessment 1. Right ear impacted cerumen (Primary) 2. Left ear impacted cerumen 3. Nodule of right ear  canal -     Ambulatory referral to ENT    Plan  Earwax removed from right and left ear canals.  Referral to ENT evaluate right ear canal with enlarged red nodule.  Risks, benefits, and alternatives of the medications and treatment plan prescribed today were discussed, and patient expressed understanding. Plan follow-up as discussed or as needed if any worsening symptoms or change in condition.

## 2023-06-27 NOTE — Progress Notes (Signed)
 Patient's chart has been reviewed by a Care Connection Specialist with [facility] Value Based Care Coordination in order to identify Health Maintenance needs. Unable to outreach patient to discuss gaps in care and preventative care needs. Unable to outreach patient by telephone or My Chart.    Health Maintenance Due  Topic Date Due  . DTaP/Tdap/Td Vaccines (1 - Tdap) Never done  . Zoster Vaccine (1 of 2) Never done  . Pneumococcal Vaccine: 65+ Years (2 of 2 - PCV) 12/31/2014  . DEXA Scan  01/17/2021  . Creatinine Level  06/29/2022  . Potassium Level  06/29/2022  . Medicare Annual Wellness  06/29/2022  . Influenza Vaccine (1) 04/22/2023    Encounter routed to PCP. No  UTR

## 2023-09-04 IMAGING — DX DG SHOULDER 2+V*L*
4 series · 4 of 4 positions shown · non-contrast
Comparison: None.

CLINICAL DATA: 89-year-old female status post fall with left
shoulder pain.

EXAM:
LEFT SHOULDER - 2+ VIEW

[shoulder ap (1 of 2)]
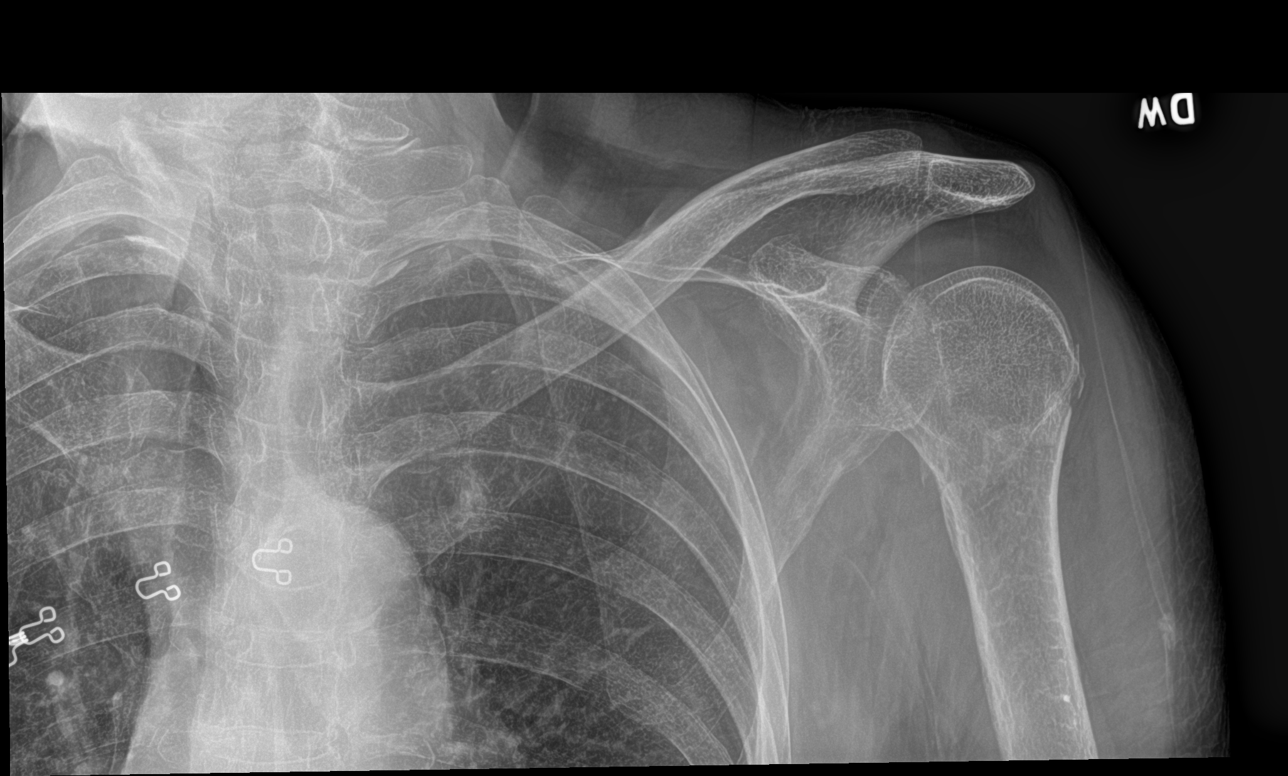

[shoulder ap (2 of 2)]
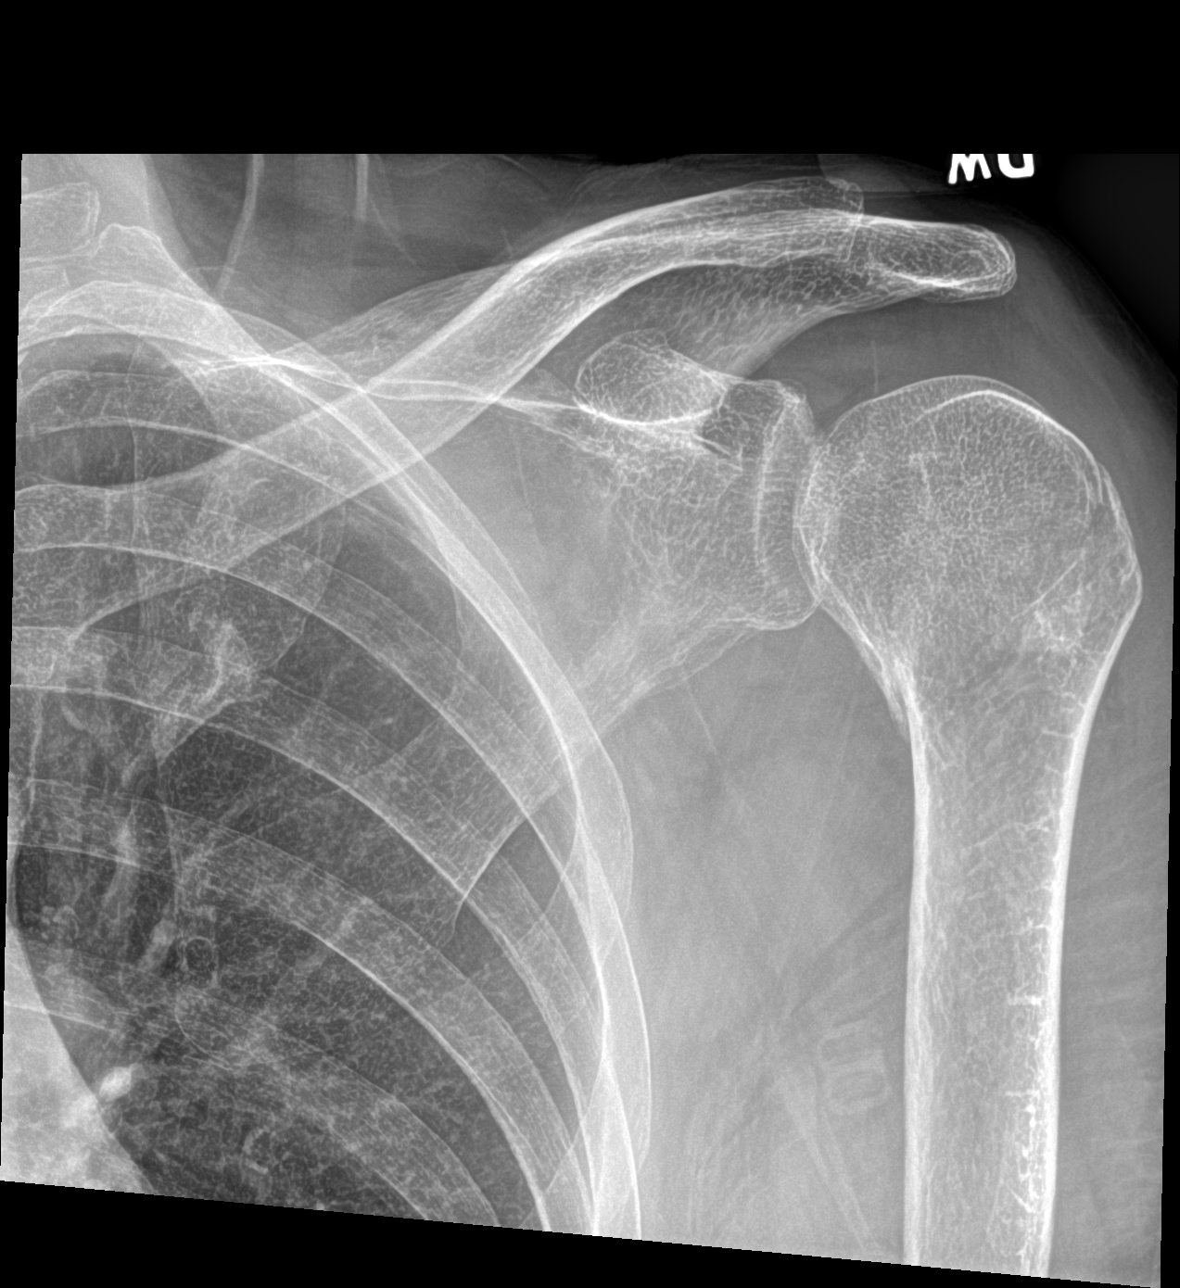

[shoulder axial (1 of 2)]
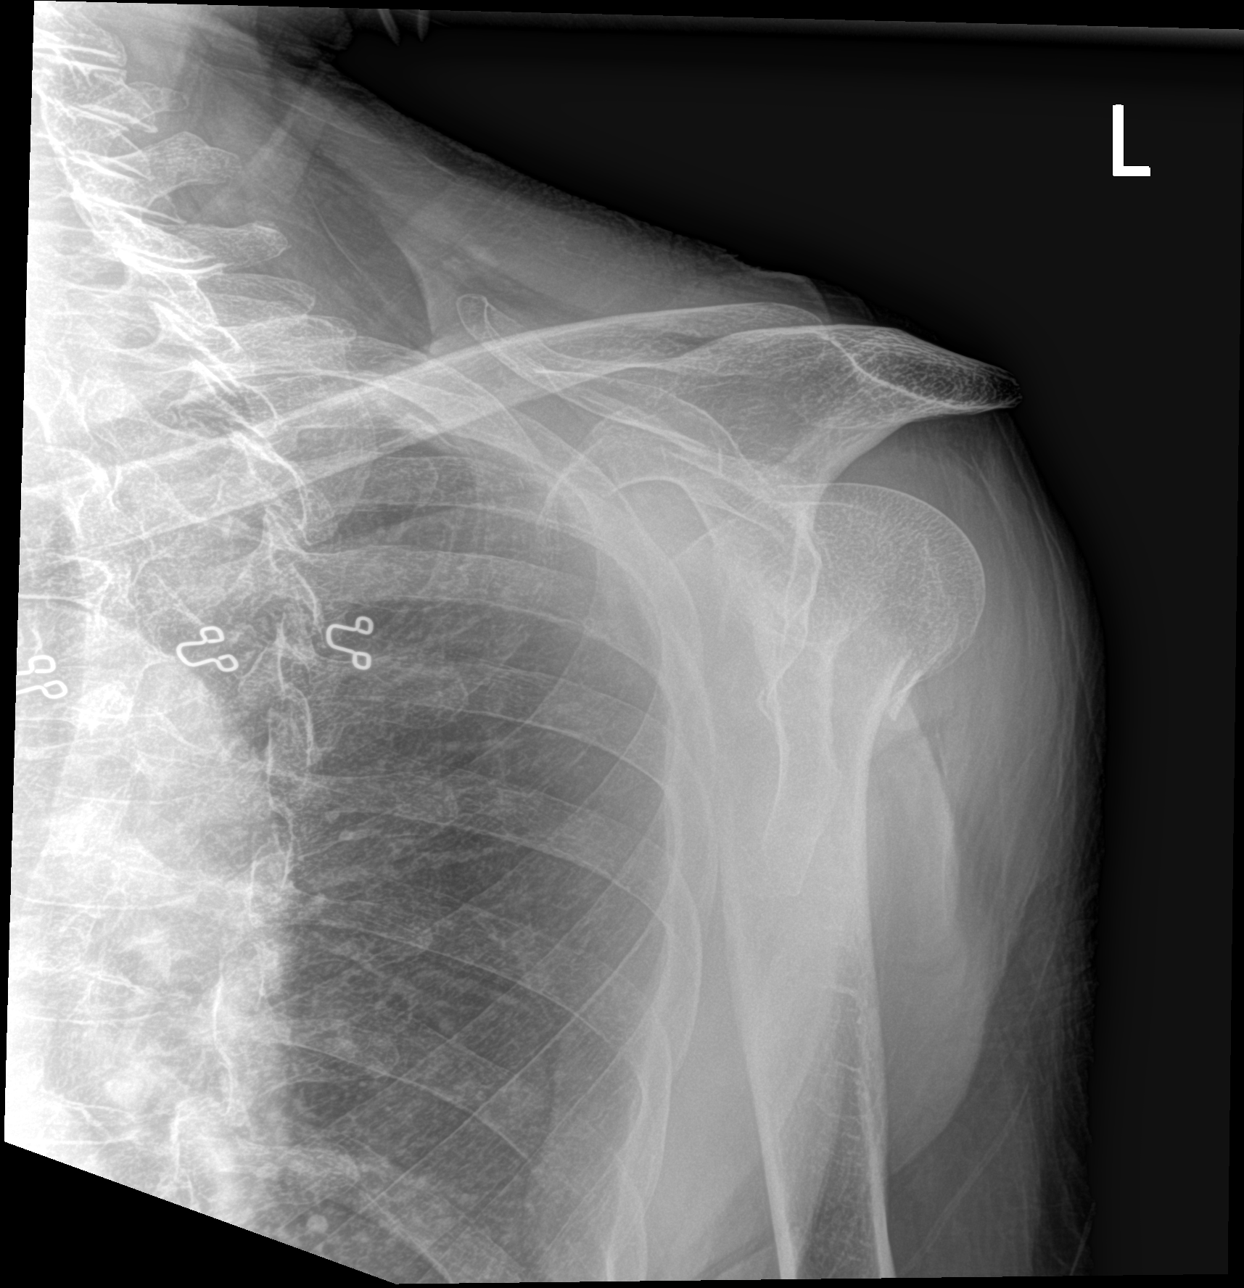

[shoulder axial (2 of 2)]
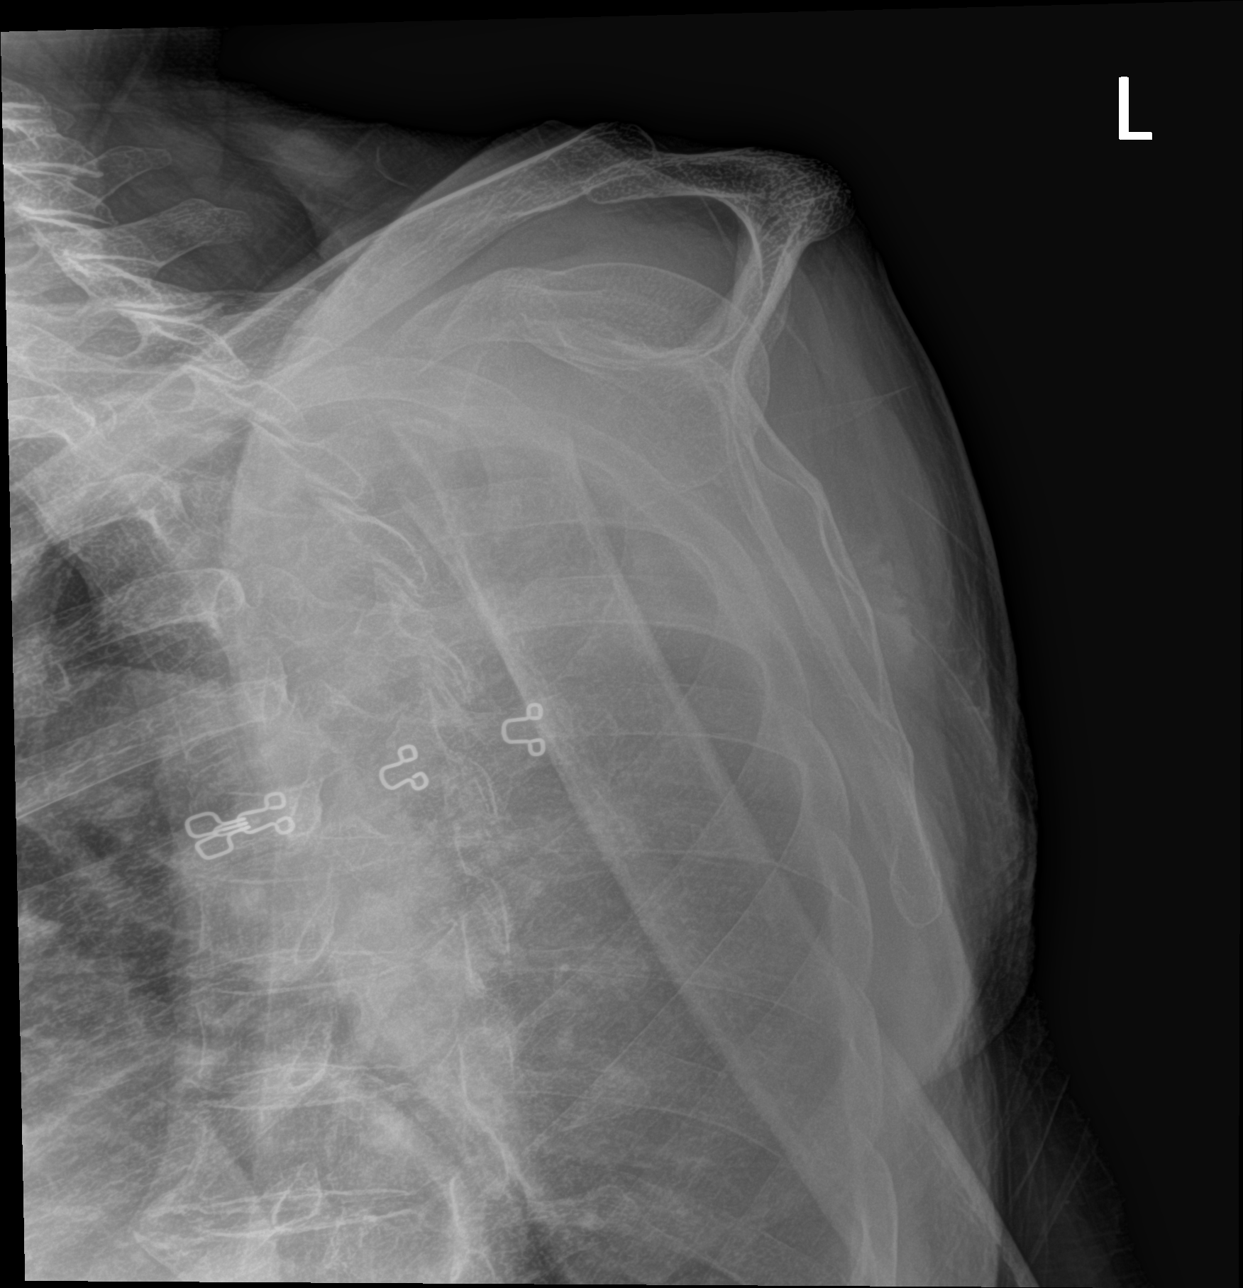

[4 of 4 positions shown; findings below may reference images not displayed]

FINDINGS: 3 of these images suggest a minimally displaced and mildly
comminuted fracture of the humeral neck, with the humeral head
articular surface remaining intact. The 4th image is an exaggerated
scapular Y-view, and suggests anterior displacement of the distal
fragment up to 8 mm with mild impaction. No glenohumeral joint
dislocation suspected. The left clavicle and scapula appear intact.

Visible left ribs and chest appear negative.
IMPRESSION: Suboptimal exam. Mildly comminuted left humeral neck fracture
appears minimally displaced on some views but might be anteriorly
displaced up to 8 mm. No glenohumeral dislocation. No left scapula
or clavicle fracture.

## 2024-05-19 ENCOUNTER — Emergency Department (HOSPITAL_COMMUNITY)

## 2024-05-19 ENCOUNTER — Other Ambulatory Visit: Payer: Self-pay

## 2024-05-19 ENCOUNTER — Inpatient Hospital Stay (HOSPITAL_COMMUNITY)
Admission: EM | Admit: 2024-05-19 | Discharge: 2024-06-03 | DRG: 922 | Disposition: A | Discharge status: Skilled Nursing Facility | Attending: Internal Medicine | Admitting: Internal Medicine

## 2024-05-19 DIAGNOSIS — Z888 Allergy status to other drugs, medicaments and biological substances status: Secondary | ICD-10-CM

## 2024-05-19 DIAGNOSIS — I10 Essential (primary) hypertension: Secondary | ICD-10-CM | POA: Diagnosis present

## 2024-05-19 DIAGNOSIS — S72114A Nondisplaced fracture of greater trochanter of right femur, initial encounter for closed fracture: Secondary | ICD-10-CM

## 2024-05-19 DIAGNOSIS — M25561 Pain in right knee: Secondary | ICD-10-CM | POA: Diagnosis present

## 2024-05-19 DIAGNOSIS — Z7189 Other specified counseling: Secondary | ICD-10-CM | POA: Diagnosis not present

## 2024-05-19 DIAGNOSIS — Y92009 Unspecified place in unspecified non-institutional (private) residence as the place of occurrence of the external cause: Secondary | ICD-10-CM | POA: Diagnosis not present

## 2024-05-19 DIAGNOSIS — F0390 Unspecified dementia without behavioral disturbance: Secondary | ICD-10-CM | POA: Diagnosis not present

## 2024-05-19 DIAGNOSIS — F32A Depression, unspecified: Secondary | ICD-10-CM | POA: Diagnosis present

## 2024-05-19 DIAGNOSIS — Z79899 Other long term (current) drug therapy: Secondary | ICD-10-CM

## 2024-05-19 DIAGNOSIS — E43 Unspecified severe protein-calorie malnutrition: Secondary | ICD-10-CM | POA: Diagnosis present

## 2024-05-19 DIAGNOSIS — F0393 Unspecified dementia, unspecified severity, with mood disturbance: Secondary | ICD-10-CM | POA: Diagnosis present

## 2024-05-19 DIAGNOSIS — S72144D Nondisplaced intertrochanteric fracture of right femur, subsequent encounter for closed fracture with routine healing: Secondary | ICD-10-CM | POA: Diagnosis not present

## 2024-05-19 DIAGNOSIS — R262 Difficulty in walking, not elsewhere classified: Secondary | ICD-10-CM | POA: Diagnosis present

## 2024-05-19 DIAGNOSIS — S72144A Nondisplaced intertrochanteric fracture of right femur, initial encounter for closed fracture: Secondary | ICD-10-CM | POA: Diagnosis present

## 2024-05-19 DIAGNOSIS — S72001A Fracture of unspecified part of neck of right femur, initial encounter for closed fracture: Secondary | ICD-10-CM | POA: Diagnosis present

## 2024-05-19 DIAGNOSIS — Z66 Do not resuscitate: Secondary | ICD-10-CM | POA: Diagnosis present

## 2024-05-19 DIAGNOSIS — Z634 Disappearance and death of family member: Secondary | ICD-10-CM

## 2024-05-19 DIAGNOSIS — E785 Hyperlipidemia, unspecified: Secondary | ICD-10-CM | POA: Diagnosis present

## 2024-05-19 DIAGNOSIS — Z681 Body mass index (BMI) 19 or less, adult: Secondary | ICD-10-CM

## 2024-05-19 DIAGNOSIS — M25571 Pain in right ankle and joints of right foot: Secondary | ICD-10-CM | POA: Diagnosis present

## 2024-05-19 DIAGNOSIS — W010XXA Fall on same level from slipping, tripping and stumbling without subsequent striking against object, initial encounter: Secondary | ICD-10-CM | POA: Diagnosis present

## 2024-05-19 DIAGNOSIS — T7601XA Adult neglect or abandonment, suspected, initial encounter: Secondary | ICD-10-CM | POA: Diagnosis present

## 2024-05-19 DIAGNOSIS — Z515 Encounter for palliative care: Secondary | ICD-10-CM | POA: Diagnosis not present

## 2024-05-19 DIAGNOSIS — W19XXXA Unspecified fall, initial encounter: Principal | ICD-10-CM

## 2024-05-19 LAB — CBC WITH DIFFERENTIAL/PLATELET
Abs Immature Granulocytes: 0.04 K/uL (ref 0.00–0.07)
Basophils Absolute: 0.1 K/uL (ref 0.0–0.1)
Basophils Relative: 1 %
Eosinophils Absolute: 0.1 K/uL (ref 0.0–0.5)
Eosinophils Relative: 1 %
HCT: 44.5 % (ref 36.0–46.0)
Hemoglobin: 14.1 g/dL (ref 12.0–15.0)
Immature Granulocytes: 0 %
Lymphocytes Relative: 10 %
Lymphs Abs: 1.2 K/uL (ref 0.7–4.0)
MCH: 30.8 pg (ref 26.0–34.0)
MCHC: 31.7 g/dL (ref 30.0–36.0)
MCV: 97.2 fL (ref 80.0–100.0)
Monocytes Absolute: 0.9 K/uL (ref 0.1–1.0)
Monocytes Relative: 7 %
Neutro Abs: 10.5 K/uL — ABNORMAL HIGH (ref 1.7–7.7)
Neutrophils Relative %: 81 %
Platelets: 410 K/uL — ABNORMAL HIGH (ref 150–400)
RBC: 4.58 MIL/uL (ref 3.87–5.11)
RDW: 12.9 % (ref 11.5–15.5)
WBC: 12.8 K/uL — ABNORMAL HIGH (ref 4.0–10.5)
nRBC: 0 % (ref 0.0–0.2)

## 2024-05-19 LAB — CBG MONITORING, ED: Glucose-Capillary: 100 mg/dL — ABNORMAL HIGH (ref 70–99)

## 2024-05-19 LAB — CK: Total CK: 51 U/L (ref 38–234)

## 2024-05-19 LAB — BASIC METABOLIC PANEL WITH GFR
Anion gap: 12 (ref 5–15)
BUN: 23 mg/dL (ref 8–23)
CO2: 27 mmol/L (ref 22–32)
Calcium: 10.5 mg/dL — ABNORMAL HIGH (ref 8.9–10.3)
Chloride: 103 mmol/L (ref 98–111)
Creatinine, Ser: 0.57 mg/dL (ref 0.44–1.00)
GFR, Estimated: >60 mL/min (ref >60)
Glucose, Bld: 108 mg/dL — ABNORMAL HIGH (ref 70–99)
Potassium: 3.7 mmol/L (ref 3.5–5.1)
Sodium: 143 mmol/L (ref 135–145)

## 2024-05-19 LAB — I-STAT CG4 LACTIC ACID, ED: Lactic Acid, Venous: 1.5 mmol/L (ref 0.5–1.9)

## 2024-05-19 LAB — HEPATIC FUNCTION PANEL
ALT: 7 U/L (ref 0–44)
AST: 20 U/L (ref 15–41)
Albumin: 3.6 g/dL (ref 3.5–5.0)
Alkaline Phosphatase: 100 U/L (ref 38–126)
Bilirubin, Direct: 0.2 mg/dL (ref 0.0–0.2)
Indirect Bilirubin: 0.4 mg/dL (ref 0.3–0.9)
Total Bilirubin: 0.6 mg/dL (ref 0.0–1.2)
Total Protein: 6.9 g/dL (ref 6.5–8.1)

## 2024-05-19 LAB — MAGNESIUM: Magnesium: 2 mg/dL (ref 1.7–2.4)

## 2024-05-19 LAB — PHOSPHORUS: Phosphorus: 2.4 mg/dL — ABNORMAL LOW (ref 2.5–4.6)

## 2024-05-19 MED ORDER — POTASSIUM PHOSPHATES 15 MMOLE/5ML IV SOLN
15.0000 mmol | Freq: Once | INTRAVENOUS | Status: AC
  Administered 2024-05-20: 15 mmol via INTRAVENOUS
  Filled 2024-05-19: qty 5

## 2024-05-19 MED ORDER — INSULIN ASPART 100 UNIT/ML IJ SOLN
0.0000 [IU] | INTRAMUSCULAR | Status: DC
  Filled 2024-05-19: qty 0.09

## 2024-05-19 MED ORDER — OXYCODONE HCL 5 MG PO TABS
5.0000 mg | ORAL_TABLET | Freq: Once | ORAL | Status: AC
  Administered 2024-05-19: 5 mg via ORAL
  Filled 2024-05-19: qty 1

## 2024-05-19 NOTE — Assessment & Plan Note (Signed)
 Allow permissive htn ?

## 2024-05-19 NOTE — ED Triage Notes (Signed)
 Patient BIB EMS from home c/o fall x 1 week. Per report patient tripped and fell on her knee last week. Patient report worsening right knee pain.  Patient denies LOC. Patient denies hitting her head. Patient denies blood thinners.   BP 142/78 HR 80 RR 20 O2sat 99% on RA CBG 215

## 2024-05-19 NOTE — Subjective & Objective (Signed)
 Coming in from home with fall about 1 week ago tripped and fell on her knee last week has been having worsening right knee pain No loss of consciousness Did not hit head Not on blood thinners On arrival blood pressure 142/78 heart rate 80 Patient has history of dementia, husband is on hospice patient has been laying in bed for the past few days into much discomfort to walk her husband has passed away today and she was sent for the evaluation Initial imaging was unremarkable but CT scan showed nondisplaced fracture of the right greater trochanteric region Orthopedics been consulted and recommended getting right hip MRI Will see patient in consult in a.m. admit to medicine overnight

## 2024-05-19 NOTE — Assessment & Plan Note (Signed)
Does not tolerate

## 2024-05-19 NOTE — Consult Note (Incomplete)
 Orthopedic Surgery Consult Note  Assessment: Patient is a 89 y.o. female with right intertrochanteric femur fracture   Plan: -Recommend operative fixation given extension into intertrochanteric region. Patient lacking capacity this morning so will have to discuss with family. Will tentatively add her on the schedule for this evening -Diet: NPO for procedure -DVT ppx: aspirin 81mg  BID post-operatively -Ancef and TXA on call to OR -Weight bearing status: NWB RLE -PT evaluate and treat post-operatively -Pain control -Dispo: pending discussion with family and possible surgical intervention  ___________________________________________________________________________   Reason for consult: right greater trochanter fracture  History:  Patient is a 89 y.o. female who had a ground level fall and noted onset of right hip and thigh pain. This happened a week ago or so. Her husband was in hospice and was predicted to expire at any time so she stayed by his side. She has had continued. Her husband has now passed so she presented to have her leg pain evaluated. Was admitted to medicine overnight. This morning she is reporting continued right hip and thigh pain. She is not having any other extremity pain. Wants to go back to sleep.  Review of systems: General: denies fevers and chills, myalgias Neurologic: denies recent changes in vision, slurred speech Abdomen: denies nausea, vomiting, hematemesis Respiratory: denies cough, shortness of breath  Past medical history:  HLD HTN  Allergies: ezetimibe, statins   Past surgical history:  Cataract surgery Tubal ligation  Social history: Denies use of nicotine-containing products (cigarettes, vaping, smokeless, etc.) Alcohol use: denies Denies use of recreational drugs  Family history: -reviewed and not pertinent to hip fracture   Physical Exam:  General: no acute distress, appears stated age Neurologic: alert, answering questions  appropriately, following commands Cardiovascular: regular rate, no cyanosis Respiratory: unlabored breathing on room air, symmetric chest rise Psychiatric: appropriate affect, normal cadence to speech  MSK:   -Bilateral upper extremities  No tenderness to palpation over extremity, no gross deformity, no open wounds Fires deltoid, biceps, triceps, wrist extensors, wrist flexors, finger extensors, finger flexors  AIN/PIN/IO intact  Palpable radial pulse  Sensation intact to light touch in median/ulnar/radial/axillary nerve distributions  Hand warm and well perfused  -Left lower extremity   No tenderness to palpation over extremity, no gross deformity, no open wounds, no pain with log roll  Fires hip flexors, quadriceps, hamstrings, tibialis anterior, gastrocnemius and soleus, extensor hallucis longus Plantarflexes and dorsiflexes toes Sensation intact to light touch in sural, saphenous, tibial, deep peroneal, and superficial peroneal nerve distributions Foot warm and well perfused  -Right lower extremity  No tenderness to palpation over extremity, except around the hip.  Pain with logroll.  No gross deformity.  No open wounds seen. Does not fire hip flexors due to pain.  Fires quadriceps, hamstrings, tibialis anterior, gastrocnemius and soleus, extensor hallucis longus Plantarflexes and dorsiflexes toes Sensation intact to light touch in sural, saphenous, tibial, deep peroneal, and superficial peroneal nerve distributions Foot warm and well perfused  Imaging: CT of the right hip from 05/19/2024 was independent reviewed and interpreted, showing an oblique fracture in the greater trochanter of the femur.  No definitive extension is seen into the intertrochanteric region.  No other fracture seen.  No dislocation seen.  MRI of the right hip demonstrated extension of the fracture into the intertrochanteric region.    Patient name: Theresa Day Patient MRN: [id number] Date: 05/19/24

## 2024-05-19 NOTE — Assessment & Plan Note (Signed)
-   management as per orthopedics,  plan to operate  in  a.m.  Keep nothing by mouth post midnight. Patient  not on anticoagulation or antiplatelet agents  Ordered type and screen, order a vitamin D level Patient at baseline unable to walk a flight of stairs or 100 feet has not tried due to poor balance   due to generalize fatigue and deconditioning Patient denies any chest pain or shortness of breath currently and/or with exertion, ECG showing no evidence of acute ischemia no known history of coronary artery disease, COPD Liver failure CKD  Given advanced age patient is at least moderate  risk  which has been discussed with family but at this point no furthther cardiac workup is indicated.

## 2024-05-19 NOTE — ED Provider Notes (Signed)
 [place] EMERGENCY DEPARTMENT AT [facility] Provider Note   CSN: [id number] Arrival date & time: 05/19/24  1629     Patient presents with: Theresa Day is a 89 y.o. female.   Patient here with right knee right hip pain after fall about a week ago.  I talked with family on the phone as patient does have some dementia.  Her husband was on hospice here recently.  Hospice was worried that he was going to pass away any day and after she had her fall she did not want to leave him.  She has basically been laying in bed within the last several days.  She has had too much discomfort to try to get up and walk.  Husband passed away today and family sent her in for evaluation.  Leg pain.  She did not hit her head.  Not on blood thinners.  Patient lives with grandson who is a primary caregiver for both her husband and herself.  Does not seem like any prior history of injury to this area.  The history is provided by the patient.       Prior to Admission medications   Medication Sig Start Date End Date Taking? Authorizing Provider  diclofenac  sodium (VOLTAREN ) 1 % GEL Apply 2 g topically 4 (four) times daily. 06/22/16   Gretel Rosina CROME, PA-C  traMADol  (ULTRAM ) 50 MG tablet Take 1 tablet (50 mg total) by mouth every 6 (six) hours as needed for up to 12 doses for severe pain. 08/18/21   Phebe Fonda RAMAN, MD  traMADol  (ULTRAM ) 50 MG tablet Take 1 tablet (50 mg total) by mouth every 6 (six) hours as needed. 11/07/21   Horton, Charmaine FALCON, MD    Allergies: Atorvastatin, Ezetimibe, Simvastatin, and Statins    Review of Systems  Updated Vital Signs BP (!) 149/75 (BP Location: Left Arm)   Pulse 77   Temp 98.3 F (36.8 C) (Oral)   Resp 20   SpO2 98%   Physical Exam Vitals and nursing note reviewed.  Constitutional:      General: She is not in acute distress.    Appearance: She is well-developed. She is not ill-appearing.     Comments: Patient appears unkept  HENT:     Head:  Normocephalic and atraumatic.     Mouth/Throat:     Mouth: Mucous membranes are dry.  Eyes:     Extraocular Movements: Extraocular movements intact.     Conjunctiva/sclera: Conjunctivae normal.     Pupils: Pupils are equal, round, and reactive to light.  Cardiovascular:     Rate and Rhythm: Normal rate and regular rhythm.     Heart sounds: No murmur heard. Pulmonary:     Effort: Pulmonary effort is normal. No respiratory distress.     Breath sounds: Normal breath sounds.  Abdominal:     Palpations: Abdomen is soft.     Tenderness: There is no abdominal tenderness.  Musculoskeletal:        General: Tenderness present. No swelling.     Cervical back: Normal range of motion and neck supple. No tenderness.     Comments: Tenderness to the right hip, right femur, right knee, no midline spinal tenderness  Skin:    General: Skin is warm and dry.     Capillary Refill: Capillary refill takes less than 2 seconds.  Neurological:     General: No focal deficit present.     Mental Status: She is alert and  oriented to person, place, and time.     Sensory: No sensory deficit.     Motor: No weakness.  Psychiatric:        Mood and Affect: Mood normal.     (all labs ordered are listed, but only abnormal results are displayed) Labs Reviewed  CBC WITH DIFFERENTIAL/PLATELET  BASIC METABOLIC PANEL WITH GFR  URINALYSIS, ROUTINE W REFLEX MICROSCOPIC  I-STAT CHEM 8, ED    EKG: EKG Interpretation Date/Time:  Monday May 19 2024 19:32:15 EDT Ventricular Rate:  92 PR Interval:  174 QRS Duration:  82 QT Interval:  384 QTC Calculation: 475 R Axis:   71  Text Interpretation: Sinus rhythm Consider left atrial enlargement Confirmed by Ruthe Cornet [phone]) on 05/19/2024 8:39:07 PM  Radiology: CT Lumbar Spine Wo Contrast Result Date: 05/19/2024 CLINICAL DATA:  Fall x1 week EXAM: CT LUMBAR SPINE WITHOUT CONTRAST TECHNIQUE: Multidetector CT imaging of the lumbar spine was performed without  intravenous contrast administration. Multiplanar CT image reconstructions were also generated. RADIATION DOSE REDUCTION: This exam was performed according to the departmental dose-optimization program which includes automated exposure control, adjustment of the mA and/or kV according to patient size and/or use of iterative reconstruction technique. COMPARISON:  CT 08/18/2021 FINDINGS: Segmentation: 5 lumbar type vertebrae. Alignment: Levoscoliosis Vertebrae: No acute fracture or focal pathologic process. Paraspinal and other soft tissues: Aortic atherosclerosis. Hiatal hernia. No acute paravertebral or paraspinal soft tissue abnormality. Diverticular disease of the sigmoid colon Disc levels: Moderate disc space narrowing at L2-L3 and advanced disc space narrowing at L3-L4. Multilevel disc bulges. At least mild canal stenosis at L4-L5 secondary to thickening of the ligamentum flavum and facet degeneration as well as mild disc disease. Multilevel moderate severe facet degenerative change. IMPRESSION: 1. No CT evidence for acute osseous abnormality. 2. Levoscoliosis and degenerative changes. 3. Aortic atherosclerosis. Aortic Atherosclerosis (ICD10-I70.0). Electronically Signed   By: Luke Bun M.D.   On: 05/19/2024 19:25   CT Hip Right Wo Contrast Result Date: 05/19/2024 EXAM: CT OF THE RIGHT HIP WITHOUT IV CONTRAST 05/19/2024 07:17:03 PM TECHNIQUE: CT of the right hip was performed without the administration of intravenous contrast. Multiplanar reformatted images are provided for review. Automated exposure control, iterative reconstruction, and/or weight based adjustment of the mA/kV was utilized to reduce the radiation dose to as low as reasonably achievable. COMPARISON: None available. CLINICAL HISTORY: Hip trauma, fracture suspected, xray done. Triage notes: Patient BIB EMS from home c/o fall x 1 week. Per report patient tripped and fell on her knee last week. Patient report worsening right knee pain. Patient  denies LOC. Patient denies hitting her head. Patient denies blood thinners. FINDINGS: BONES: Nondisplaced fracture of the greater trochanter. No evidence of extension into the femoral neck or acetabulum. No aggressive appearing osseous abnormality or periostitis. SOFT TISSUE: No significant soft tissue edema or fluid collections. No soft tissue mass. JOINT: Degenerative arthritis right hip. No osseous erosions. INTRAPELVIC CONTENTS: Limited images of the intrapelvic contents are unremarkable. IMPRESSION: 1. Nondisplaced fracture of the right greater trochanter. Electronically signed by: Norman Gatlin MD 05/19/2024 07:21 PM EDT RP Workstation: [id number]   DG Hip Unilat With Pelvis 2-3 Views Right Result Date: 05/19/2024 CLINICAL DATA:  Fall and right hip pain. EXAM: DG HIP (WITH OR WITHOUT PELVIS) 2-3V RIGHT; RIGHT FEMUR 2 VIEWS COMPARISON:  None Available. FINDINGS: No acute fracture or dislocation. The bones are osteopenic. Moderate bilateral hip arthritic changes. The soft tissues are unremarkable IMPRESSION: 1. No acute fracture or dislocation. 2. Moderate bilateral hip  arthritic changes. Electronically Signed   By: Vanetta Chou M.D.   On: 05/19/2024 18:11   DG Femur Min 2 Views Right Result Date: 05/19/2024 CLINICAL DATA:  Fall and right hip pain. EXAM: DG HIP (WITH OR WITHOUT PELVIS) 2-3V RIGHT; RIGHT FEMUR 2 VIEWS COMPARISON:  None Available. FINDINGS: No acute fracture or dislocation. The bones are osteopenic. Moderate bilateral hip arthritic changes. The soft tissues are unremarkable IMPRESSION: 1. No acute fracture or dislocation. 2. Moderate bilateral hip arthritic changes. Electronically Signed   By: Vanetta Chou M.D.   On: 05/19/2024 18:11   DG Knee Complete 4 Views Right Result Date: 05/19/2024 CLINICAL DATA:  pain EXAM: RIGHT KNEE - COMPLETE 4+ VIEW COMPARISON:  None Available. FINDINGS: Diffuse osteopenia.No acute fracture or dislocation. No joint effusion. Mild tricompartmental  joint space loss. Soft tissues are unremarkable. IMPRESSION: Diffuse osteopenia.  No acute fracture or dislocation. Electronically Signed   By: Rogelia Myers M.D.   On: 05/19/2024 17:23     Procedures   Medications Ordered in the ED  oxyCODONE  (Oxy IR/ROXICODONE ) immediate release tablet 5 mg (5 mg Oral Given 05/19/24 2044)                                    Medical Decision Making Amount and/or Complexity of Data Reviewed Labs: ordered. Radiology: ordered.  Risk Prescription drug management. Decision regarding hospitalization.   Ronal LELON Day is here with right knee pain and right hip pain after a fall about a week ago.  She did not come to the hospital because her husband was currently on hospice and was about to pass away.  He passed away today and family brought her here for evaluation.  X-rays of the right hip right knee right femur were obtained that were unremarkable.  She is unable to really bear weight still with a lot of discomfort therefore CT scan was obtained that showed nondisplaced fracture of the right greater trochanter.  Overall I talked with Dr. Georgina with orthopedics.  He is recommending MRI of the right hip to ensure that this is not a surgical fracture.  Patient to be admitted to the hospitalist service for MRI.  Even if not surgical patient will need PT OT supportive care as she has not been able to bear weight.  Medical clearance labs have been ordered.  Patient to be admitted to hospitalist service for further care.  Should be held n.p.o. at midnight.  This chart was dictated using voice recognition software.  Despite best efforts to proofread,  errors can occur which can change the documentation meaning.      Final diagnoses:  Fall, initial encounter  Closed nondisplaced fracture of greater trochanter of right femur, initial encounter [facility])    ED Discharge Orders     None          Ruthe Cornet, DO 05/19/24 2045

## 2024-05-19 NOTE — H&P (Signed)
 Theresa Day FMW:[id number] DOB: 01/05/1930 DOA: 05/19/2024     PCP: Nena Rosina CROME, PA-C     Patient arrived to ER on 05/19/24 at 1629 Referred by Attending Ruthe Cornet, DO  Patient coming from:    home Lives   With family    Chief Complaint:   Chief Complaint  Patient presents with   Fall    HPI: Theresa Day is a 89 y.o. female with medical history significant of dementia, HTN ,HLD    Presented with  fall a week ago Coming in from home with fall about 1 week ago tripped and fell on her knee last week has been having worsening right knee pain No loss of consciousness Did not hit head Not on blood thinners On arrival blood pressure 142/78 heart rate 80 Patient has history of dementia, husband is on hospice patient has been laying in bed for the past few days into much discomfort to walk her husband has passed away today and she was sent for the evaluation Initial imaging was unremarkable but CT scan showed nondisplaced fracture of the right greater trochanteric region Orthopedics been consulted and recommended getting right hip MRI Will see patient in consult in a.m. admit to medicine overnight    Patient no longer takes medications for blood pressure or cholesterol Denies significant ETOH intake  Does not smoke   Regarding pertinent Chronic problems:      HTN on used to be taking lisinopril       Dementia -not on medications   While in ER:   Initial plan imaging unremarkable but CT scan Showed nondisplaced fracture of the right greater trochanter Dr. Georgina with orthopedics been consulted recommended transfer to [facility] and MRI     Lab Orders         CBC with Differential         Basic metabolic panel         Urinalysis, Routine w reflex microscopic -Urine, Clean Catch         I-stat chem 8, ED (not at [facility], [facility] or [facility])     CXR - NON acute    Following Medications were ordered in ER: Medications  oxyCODONE  (Oxy IR/ROXICODONE ) immediate  release tablet 5 mg (has no administration in time range)    _______________________________________________________ ER Provider Called:    Orthopedics   Dr.Moore  They Recommend admit to medicine   Will see in AM      ED Triage Vitals  Encounter Vitals Group     BP 05/19/24 1641 129/88     Girls Systolic BP Percentile --      Girls Diastolic BP Percentile --      Boys Systolic BP Percentile --      Boys Diastolic BP Percentile --      Pulse Rate 05/19/24 1641 100     Resp 05/19/24 1641 18     Temp 05/19/24 1641 98.2 F (36.8 C)     Temp Source 05/19/24 1641 Oral     SpO2 05/19/24 1641 100 %     Weight --      Height --      Head Circumference --      Peak Flow --      Pain Score 05/19/24 1638 8     Pain Loc --      Pain Education --      Exclude from Growth Chart --   UFJK(75)@     _________________________________________ Significant  initial  Findings: Abnormal Labs Reviewed - No abnormal labs to display     Cardiac Panel (last 3 results) Recent Labs    05/19/24 2048  CKTOTAL 51    ECG: Ordered Personally reviewed and interpreted by me showing: HR : 92 Rhythm: Sinus rhythm Consider left atrial enlargement QTC 475    The recent clinical data is shown below. Vitals:   05/19/24 1641 05/19/24 1930  BP: 129/88 (!) 149/75  Pulse: 100 77  Resp: 18 20  Temp: 98.2 F (36.8 C) 98.3 F (36.8 C)  TempSrc: Oral Oral  SpO2: 100% 98%    WBC     Component Value Date/Time   WBC 11.4 (H) 12/14/2009 1732   LYMPHSABS 1.1 12/14/2009 1732   MONOABS 0.5 12/14/2009 1732   EOSABS 0.0 12/14/2009 1732   BASOSABS 0.1 12/14/2009 1732      Results for orders placed or performed during the hospital encounter of 12/14/09  Urine culture     Status: None   Collection Time: 12/14/09  4:08 PM   Specimen: Urine, Clean Catch  Result Value Ref Range Status   Specimen Description URINE, CLEAN CATCH  Final   Special Requests ADDED ON [id number] @2300   Final   Colony Count 30,000  COLONIES/ML  Final   Culture   Final    Multiple bacterial morphotypes present, none predominant. Suggest appropriate recollection if clinically indicated.   Report Status 12/16/2009 FINAL  Final    __________________________________________________________ Recent Labs  Lab 05/19/24 2033 05/19/24 2048  NA 143  --   K 3.7  --   CO2 27  --   GLUCOSE 108*  --   BUN 23  --   CREATININE 0.57  --   CALCIUM 10.5*  --   MG  --  2.0  PHOS  --  2.4*    Cr   stable,   Lab Results  Component Value Date   CREATININE 0.84 12/14/2009    Recent Labs  Lab 05/19/24 2048  AST 20  ALT 7  ALKPHOS 100  BILITOT 0.6  PROT 6.9  ALBUMIN 3.6   Lab Results  Component Value Date   CALCIUM 10.3 12/14/2009       Plt: Lab Results  Component Value Date   PLT 250 12/14/2009         Recent Labs  Lab 05/19/24 2033  WBC 12.8*  NEUTROABS 10.5*  HGB 14.1  HCT 44.5  MCV 97.2  PLT 410*    HG/HCT stable,       Component Value Date/Time   HGB 14.1 05/19/2024 2033   HCT 44.5 05/19/2024 2033   MCV 97.2 05/19/2024 2033     _______________________________________________ Hospitalist was called for admission for   Fall,  Closed nondisplaced fracture of greater trochanter of right femur, I   The following Work up has been ordered so far:  Orders Placed This Encounter  Procedures   DG Knee Complete 4 Views Right   DG Hip Unilat With Pelvis 2-3 Views Right   DG Femur Min 2 Views Right   CT Hip Right Wo Contrast   CT Lumbar Spine Wo Contrast   MR HIP RIGHT WO CONTRAST   CBC with Differential   Basic metabolic panel   Urinalysis, Routine w reflex microscopic -Urine, Clean Catch   Consult to orthopedic surgery   Consult to hospitalist   I-stat chem 8, ED (not at [facility], [facility] or [facility])   ED EKG   EKG 12-Lead  OTHER Significant initial  Findings:  labs showing:     DM  labs:  HbA1C: No results for input(s): HGBA1C in the last 8760 hours.     CBG (last 3)  Recent Labs     05/19/24 2142  GLUCAP 100*          Cultures:    Component Value Date/Time   SDES URINE, CLEAN CATCH 12/14/2009 1608   SPECREQUEST ADDED ON [id number] @2300  12/14/2009 1608   CULT  12/14/2009 1608    Multiple bacterial morphotypes present, none predominant. Suggest appropriate recollection if clinically indicated.   REPTSTATUS 12/16/2009 FINAL 12/14/2009 1608     Radiological Exams on Admission: CT Lumbar Spine Wo Contrast Result Date: 05/19/2024 CLINICAL DATA:  Fall x1 week EXAM: CT LUMBAR SPINE WITHOUT CONTRAST TECHNIQUE: Multidetector CT imaging of the lumbar spine was performed without intravenous contrast administration. Multiplanar CT image reconstructions were also generated. RADIATION DOSE REDUCTION: This exam was performed according to the departmental dose-optimization program which includes automated exposure control, adjustment of the mA and/or kV according to patient size and/or use of iterative reconstruction technique. COMPARISON:  CT 08/18/2021 FINDINGS: Segmentation: 5 lumbar type vertebrae. Alignment: Levoscoliosis Vertebrae: No acute fracture or focal pathologic process. Paraspinal and other soft tissues: Aortic atherosclerosis. Hiatal hernia. No acute paravertebral or paraspinal soft tissue abnormality. Diverticular disease of the sigmoid colon Disc levels: Moderate disc space narrowing at L2-L3 and advanced disc space narrowing at L3-L4. Multilevel disc bulges. At least mild canal stenosis at L4-L5 secondary to thickening of the ligamentum flavum and facet degeneration as well as mild disc disease. Multilevel moderate severe facet degenerative change. IMPRESSION: 1. No CT evidence for acute osseous abnormality. 2. Levoscoliosis and degenerative changes. 3. Aortic atherosclerosis. Aortic Atherosclerosis (ICD10-I70.0). Electronically Signed   By: Luke Bun M.D.   On: 05/19/2024 19:25   CT Hip Right Wo Contrast Result Date: 05/19/2024 EXAM: CT OF THE RIGHT HIP WITHOUT IV  CONTRAST 05/19/2024 07:17:03 PM TECHNIQUE: CT of the right hip was performed without the administration of intravenous contrast. Multiplanar reformatted images are provided for review. Automated exposure control, iterative reconstruction, and/or weight based adjustment of the mA/kV was utilized to reduce the radiation dose to as low as reasonably achievable. COMPARISON: None available. CLINICAL HISTORY: Hip trauma, fracture suspected, xray done. Triage notes: Patient BIB EMS from home c/o fall x 1 week. Per report patient tripped and fell on her knee last week. Patient report worsening right knee pain. Patient denies LOC. Patient denies hitting her head. Patient denies blood thinners. FINDINGS: BONES: Nondisplaced fracture of the greater trochanter. No evidence of extension into the femoral neck or acetabulum. No aggressive appearing osseous abnormality or periostitis. SOFT TISSUE: No significant soft tissue edema or fluid collections. No soft tissue mass. JOINT: Degenerative arthritis right hip. No osseous erosions. INTRAPELVIC CONTENTS: Limited images of the intrapelvic contents are unremarkable. IMPRESSION: 1. Nondisplaced fracture of the right greater trochanter. Electronically signed by: Norman Gatlin MD 05/19/2024 07:21 PM EDT RP Workstation: [id number]   DG Hip Unilat With Pelvis 2-3 Views Right Result Date: 05/19/2024 CLINICAL DATA:  Fall and right hip pain. EXAM: DG HIP (WITH OR WITHOUT PELVIS) 2-3V RIGHT; RIGHT FEMUR 2 VIEWS COMPARISON:  None Available. FINDINGS: No acute fracture or dislocation. The bones are osteopenic. Moderate bilateral hip arthritic changes. The soft tissues are unremarkable IMPRESSION: 1. No acute fracture or dislocation. 2. Moderate bilateral hip arthritic changes. Electronically Signed   By: Vanetta Chou M.D.   On: 05/19/2024 18:11  DG Femur Min 2 Views Right Result Date: 05/19/2024 CLINICAL DATA:  Fall and right hip pain. EXAM: DG HIP (WITH OR WITHOUT PELVIS) 2-3V  RIGHT; RIGHT FEMUR 2 VIEWS COMPARISON:  None Available. FINDINGS: No acute fracture or dislocation. The bones are osteopenic. Moderate bilateral hip arthritic changes. The soft tissues are unremarkable IMPRESSION: 1. No acute fracture or dislocation. 2. Moderate bilateral hip arthritic changes. Electronically Signed   By: Vanetta Chou M.D.   On: 05/19/2024 18:11   DG Knee Complete 4 Views Right Result Date: 05/19/2024 CLINICAL DATA:  pain EXAM: RIGHT KNEE - COMPLETE 4+ VIEW COMPARISON:  None Available. FINDINGS: Diffuse osteopenia.No acute fracture or dislocation. No joint effusion. Mild tricompartmental joint space loss. Soft tissues are unremarkable. IMPRESSION: Diffuse osteopenia.  No acute fracture or dislocation. Electronically Signed   By: Rogelia Myers M.D.   On: 05/19/2024 17:23   _______________________________________________________________________________________________________ Latest  Blood pressure (!) 149/75, pulse 77, temperature 98.3 F (36.8 C), temperature source Oral, resp. rate 20, SpO2 98%.   Vitals  labs and radiology finding personally reviewed  Review of Systems:    Pertinent positives include:  fall  Constitutional:  No weight loss, night sweats, Fevers, chills, fatigue, weight loss  HEENT:  No headaches, Difficulty swallowing,Tooth/dental problems,Sore throat,  No sneezing, itching, ear ache, nasal congestion, post nasal drip,  Cardio-vascular:  No chest pain, Orthopnea, PND, anasarca, dizziness, palpitations.no Bilateral lower extremity swelling  GI:  No heartburn, indigestion, abdominal pain, nausea, vomiting, diarrhea, change in bowel habits, loss of appetite, melena, blood in stool, hematemesis Resp:  no shortness of breath at rest. No dyspnea on exertion, No excess mucus, no productive cough, No non-productive cough, No coughing up of blood.No change in color of mucus.No wheezing. Skin:  no rash or lesions. No jaundice GU:  no dysuria, change in  color of urine, no urgency or frequency. No straining to urinate.  No flank pain.  Musculoskeletal:  No joint pain or no joint swelling. No decreased range of motion. No back pain.  Psych:  No change in mood or affect. No depression or anxiety. No memory loss.  Neuro: no localizing neurological complaints, no tingling, no weakness, no double vision, no gait abnormality, no slurred speech, no confusion  All systems reviewed and apart from HOPI all are negative _______________________________________________________________________________________________ Past Medical History:  No past medical history on file.    No past surgical history on file.  Social History:  Ambulatory  walker     reports that she has never smoked. She has never used smokeless tobacco. She reports that she does not drink alcohol and does not use drugs.     Family History:   No family history on file. ______________________________________________________________________________________________ Allergies: Allergies  Allergen Reactions   Atorvastatin Other (See Comments)    Leg weakness   Ezetimibe     weakness   Simvastatin     weakness   Statins Rash     Prior to Admission medications   Medication Sig Start Date End Date Taking? Authorizing Provider  diclofenac  sodium (VOLTAREN ) 1 % GEL Apply 2 g topically 4 (four) times daily. 06/22/16   Gretel Rosina CROME, PA-C  traMADol  (ULTRAM ) 50 MG tablet Take 1 tablet (50 mg total) by mouth every 6 (six) hours as needed for up to 12 doses for severe pain. 08/18/21   Phebe Fonda RAMAN, MD  traMADol  (ULTRAM ) 50 MG tablet Take 1 tablet (50 mg total) by mouth every 6 (six) hours as needed. 11/07/21  Bari Charmaine FALCON, MD    ___________________________________________________________________________________________________ Physical Exam:    05/19/2024    7:30 PM 05/19/2024    4:41 PM 11/07/2021    5:45 AM  Vitals with BMI  Systolic 149 129 863  Diastolic 75 88 65   Pulse 77 100 69     1. General:  in No  Acute distress   Chronically ill   -appearing 2. Psychological: Alert and   Oriented 3. Head/ENT:    Dry Mucous Membranes                          Head Non traumatic, neck supple                         Poor Dentition 4. SKIN: decreased Skin turgor,  Skin clean Dry and intact no rash    5. Heart: Regular rate and rhythm no  Murmur, no Rub or gallop 6. Lungs:  , no wheezes or crackles   7. Abdomen: Soft, non-tender, Non distended  bowel sounds present 8. Lower extremities: no clubbing, cyanosis, no  edema 9. Neurologically Grossly intact, moving all 4 extremities equally   10. MSK: Normal range of motion    Chart has been reviewed  ______________________________________________________________________________________________  Assessment/Plan  89 y.o. female with medical history significant of dementia, HTN ,HLD  Admitted for   Fall,  Closed nondisplaced fracture of greater trochanter of right femur,      Present on Admission:  Closed right hip fracture (HCC)  Essential hypertension  Hyperlipidemia    Essential hypertension Allow permissive htn   Closed right hip fracture (HCC)  - management as per orthopedics,  plan to operate  in  a.m.  Keep nothing by mouth post midnight. Patient  not on anticoagulation or antiplatelet agents  Ordered type and screen, order a vitamin D level Patient at baseline unable to walk a flight of stairs or 100 feet has not tried due to poor balance   due to generalize fatigue and deconditioning Patient denies any chest pain or shortness of breath currently and/or with exertion, ECG showing no evidence of acute ischemia no known history of coronary artery disease, COPD Liver failure CKD  Given advanced age patient is at least moderate  risk  which has been discussed with family but at this point no furthther cardiac workup is indicated.  Hyperlipidemia Does not tolerate    Other plan as per  orders.  DVT prophylaxis:  SCD      Code Status:  DNR/DNI  as per patient   I had personally discussed CODE STATUS with patient  ACP   none    Family Communication:   Family not at  Bedside  plan of care was discussed on the phone with grandSon,   Diet heart healthy, npo post midnight   Disposition Plan:     likely will need placement for rehabilitation                           Following barriers for discharge:                                                          Pain controlled with PO medications  Will need consultants to evaluate patient prior to discharge                                   Consult Orders  (From admission, onward)           Start     Ordered   05/19/24 2016  Consult to hospitalist  Once       Provider:  (Not yet assigned)  Question Answer Comment  Place call to: [facility]   Reason for Consult Admit      05/19/24 2015             Consults called:  orthopedics   Admission status:  ED Disposition     ED Disposition  Admit   Condition  --   Comment  Hospital Area: [facility] [[id number]]  Level of Care: Telemetry Medical [[id number]]  May admit patient to [facility] or [facility] if equivalent level of care is available:: No  Covid Evaluation: Asymptomatic - no recent exposure (last 10 days) testing not required  Diagnosis: Closed right hip fracture [facility]) [[id number]]  Admitting Physician: Keairra Bardon [[id number]]  Attending Physician: Ervey Fallin [[id number]]  Certification:: I certify there are rare and unusual circumstances requiring inpatient admission              inpatient     I Expect 2 midnight stay secondary to severity of patient's current illness need for inpatient interventions justified by the following:     Severe lab/radiological/exam abnormalities including:      Closed nondisplaced fracture of greater trochanter of right femur,     and extensive comorbidities including:   Dementia    Level of care     tele  For 12H     Juliahna Wiswell 05/19/2024, 10:19 PM    [facility]   after 2 AM please page floor coverage   If 7AM-7PM, please contact the day team taking care of the patient using [email or web address]

## 2024-05-19 NOTE — Progress Notes (Signed)
 Brief Orthopedic Note  Spoke with Dr. Ruthe with the ER about this patient and reviewed her chart. She has a right greater trochanteric femur fracture which is visualized on the CT scan. This fracture pattern often extends into the intertrochanteric region. A MRI is better at demonstrating this extension so recommended getting a right hip MRI. For now, non weight bearing right lower extremity. If it does show extension into the intertrochanteric region, would recommend operative management. If it does not show any extension into the intertochanteric region on MRI, patient would be able to weight bear as tolerated and no operative intervention would be recommended in that scenario. NPO at midnight until MRI is reviewed. Full consult note to follow in AM.    Theresa DELENA Ada, MD Orthopedic Surgeon

## 2024-05-20 ENCOUNTER — Encounter (HOSPITAL_COMMUNITY)
Admission: EM | Disposition: A | Discharge status: Skilled Nursing Facility | Payer: Self-pay | Source: Home / Self Care | Attending: Internal Medicine

## 2024-05-20 ENCOUNTER — Inpatient Hospital Stay (HOSPITAL_COMMUNITY)

## 2024-05-20 DIAGNOSIS — S72144A Nondisplaced intertrochanteric fracture of right femur, initial encounter for closed fracture: Secondary | ICD-10-CM

## 2024-05-20 DIAGNOSIS — F0390 Unspecified dementia without behavioral disturbance: Secondary | ICD-10-CM | POA: Diagnosis not present

## 2024-05-20 DIAGNOSIS — E43 Unspecified severe protein-calorie malnutrition: Secondary | ICD-10-CM | POA: Insufficient documentation

## 2024-05-20 LAB — BASIC METABOLIC PANEL WITH GFR
Anion gap: 12 (ref 5–15)
BUN: 19 mg/dL (ref 8–23)
CO2: 27 mmol/L (ref 22–32)
Calcium: 9.7 mg/dL (ref 8.9–10.3)
Chloride: 101 mmol/L (ref 98–111)
Creatinine, Ser: 0.63 mg/dL (ref 0.44–1.00)
GFR, Estimated: >60 mL/min (ref >60)
Glucose, Bld: 87 mg/dL (ref 70–99)
Potassium: 3.3 mmol/L — ABNORMAL LOW (ref 3.5–5.1)
Sodium: 140 mmol/L (ref 135–145)

## 2024-05-20 LAB — TYPE AND SCREEN
ABO/RH(D): A POS
Antibody Screen: NEGATIVE

## 2024-05-20 LAB — CBC
HCT: 39.6 % (ref 36.0–46.0)
Hemoglobin: 13.1 g/dL (ref 12.0–15.0)
MCH: 31.5 pg (ref 26.0–34.0)
MCHC: 33.1 g/dL (ref 30.0–36.0)
MCV: 95.2 fL (ref 80.0–100.0)
Platelets: 349 K/uL (ref 150–400)
RBC: 4.16 MIL/uL (ref 3.87–5.11)
RDW: 13 % (ref 11.5–15.5)
WBC: 8.8 K/uL (ref 4.0–10.5)
nRBC: 0 % (ref 0.0–0.2)

## 2024-05-20 LAB — CBG MONITORING, ED: Glucose-Capillary: 87 mg/dL (ref 70–99)

## 2024-05-20 LAB — VITAMIN D 25 HYDROXY (VIT D DEFICIENCY, FRACTURES): Vit D, 25-Hydroxy: 26.74 ng/mL — ABNORMAL LOW (ref 30–100)

## 2024-05-20 LAB — GLUCOSE, CAPILLARY
Glucose-Capillary: 90 mg/dL (ref 70–99)
Glucose-Capillary: 90 mg/dL (ref 70–99)
Glucose-Capillary: 145 mg/dL — ABNORMAL HIGH (ref 70–99)

## 2024-05-20 LAB — SURGICAL PCR SCREEN
MRSA, PCR: NEGATIVE
Staphylococcus aureus: NEGATIVE

## 2024-05-20 LAB — ABO/RH: ABO/RH(D): A POS

## 2024-05-20 LAB — HEMOGLOBIN A1C
Hgb A1c MFr Bld: 5.8 % — ABNORMAL HIGH (ref 4.8–5.6)
Mean Plasma Glucose: 119.76 mg/dL

## 2024-05-20 SURGERY — FIXATION, FRACTURE, INTERTROCHANTERIC, WITH INTRAMEDULLARY ROD
Anesthesia: Choice | Laterality: Right

## 2024-05-20 MED ORDER — ADULT MULTIVITAMIN W/MINERALS CH
1.0000 | ORAL_TABLET | Freq: Every day | ORAL | Status: DC
  Administered 2024-05-20 – 2024-06-03 (×14): 1 via ORAL
  Filled 2024-05-20 (×14): qty 1

## 2024-05-20 MED ORDER — METHOCARBAMOL 500 MG PO TABS
500.0000 mg | ORAL_TABLET | Freq: Four times a day (QID) | ORAL | Status: DC | PRN
  Administered 2024-05-24 – 2024-05-26 (×3): 500 mg via ORAL
  Filled 2024-05-20 (×4): qty 1

## 2024-05-20 MED ORDER — TRAMADOL HCL 50 MG PO TABS: 50.0000 mg | ORAL_TABLET | Freq: Four times a day (QID) | ORAL | Status: DC | PRN

## 2024-05-20 MED ORDER — METHOCARBAMOL 1000 MG/10ML IJ SOLN
500.0000 mg | Freq: Four times a day (QID) | INTRAMUSCULAR | Status: DC | PRN
  Administered 2024-05-20: 500 mg via INTRAVENOUS
  Filled 2024-05-20: qty 10

## 2024-05-20 MED ORDER — HYDROCODONE-ACETAMINOPHEN 5-325 MG PO TABS: 1.0000 | ORAL_TABLET | Freq: Four times a day (QID) | ORAL | Status: DC | PRN

## 2024-05-20 MED ORDER — VITAMIN D 25 MCG (1000 UNIT) PO TABS
1000.0000 [IU] | ORAL_TABLET | Freq: Every day | ORAL | Status: DC
  Administered 2024-05-20 – 2024-06-03 (×14): 1000 [IU] via ORAL
  Filled 2024-05-20 (×14): qty 1

## 2024-05-20 MED ORDER — MORPHINE SULFATE (PF) 2 MG/ML IV SOLN
0.5000 mg | INTRAVENOUS | Status: DC | PRN
  Administered 2024-05-20: 0.5 mg via INTRAVENOUS
  Filled 2024-05-20: qty 1

## 2024-05-20 MED ORDER — ENSURE PLUS HIGH PROTEIN PO LIQD
237.0000 mL | Freq: Two times a day (BID) | ORAL | Status: DC
  Administered 2024-05-20 – 2024-06-03 (×26): 237 mL via ORAL

## 2024-05-20 NOTE — Assessment & Plan Note (Signed)
-   Noted.  Advanced age and no mortality benefit with treatment

## 2024-05-20 NOTE — Progress Notes (Signed)
 CSW received call from [facility] to inform that they have open APS case on this pt.  [id number].   Cathlyn Ferry, MSW, LCSW 9/30/20251:26 PM

## 2024-05-20 NOTE — Plan of Care (Signed)

## 2024-05-20 NOTE — Assessment & Plan Note (Signed)
-   Mood stable.  Cooperative

## 2024-05-20 NOTE — Assessment & Plan Note (Signed)
Controlled without medication currently.

## 2024-05-20 NOTE — Progress Notes (Signed)
 Progress Note    Theresa Day   FMW:[id number]  DOB: 1930/08/21  DOA: 05/19/2024     1 PCP: Theresa Day  Initial CC: [facility] Course: [name] is a 89 yo female with PMH dementia, HTN, HLD who presented after a mechanical fall.  Fall had taken place approximately 1 week prior to admission.  She had tripped and fallen on her right knee. Multiple imaging studies performed on admission in the ER.  Ultimately found to have acute right intertrochanteric hip fracture without displacement. Orthopedic surgery consulted on admission.   Interval History:  Resting comfortably in bed this morning.  Dry mouth asking for some ice chips.  Otherwise doing okay and understands plan for surgery later today.  Assessment and Plan: * Closed nondisplaced intertrochanteric fracture of right femur (HCC) - per MRI right hip: Acute right intertrochanteric hip fracture without displacement, with surrounding marrow and regional soft tissue edema. - continue pain control - Orthopedic surgery following with plans for operative repair today - Keep n.p.o.  - PT/OT evals after surgery -Aspirin planned for prophylaxis postop per orthopedics  Dementia without behavioral disturbance (HCC) - Mood stable.  Cooperative  Hyperlipidemia - Noted.  Advanced age and no mortality benefit with treatment  Essential hypertension - Controlled without medication currently   Old records reviewed in assessment of this patient  Antimicrobials:   DVT prophylaxis:  SCDs Start: 05/20/24 0415   Code Status:   Code Status: Limited: Do not attempt resuscitation (DNR) -DNR-LIMITED -Do Not Intubate/DNI   Mobility Assessment (Last 72 Hours)     Mobility Assessment     Row Name 05/20/24 1017 05/20/24 0500         Does the patient have exclusion criteria? No - Perform mobility assessment No - Perform mobility assessment      What is the highest level of mobility based on the mobility  assessment? Level 1 (Bedfast) - Unable to balance while sitting on edge of bed Level 1 (Bedfast) - Unable to balance while sitting on edge of bed      Is the above level different from baseline mobility prior to current illness? -- Yes - Recommend PT order         Barriers to discharge: None Disposition Plan: SNF HH orders placed: N/A Status is: Inpatient  Objective: Blood pressure 116/60, pulse 66, temperature 97.8 F (36.6 C), temperature source Oral, resp. rate 16, height 5' (1.524 m), weight 35.5 kg, SpO2 99%.  Examination:  Physical Exam Constitutional:      Appearance: Normal appearance.     Comments: Pleasant elderly woman laying in bed in no distress.  Very thin  HENT:     Head: Normocephalic and atraumatic.     Mouth/Throat:     Mouth: Mucous membranes are moist.  Eyes:     Extraocular Movements: Extraocular movements intact.  Cardiovascular:     Rate and Rhythm: Normal rate and regular rhythm.  Pulmonary:     Effort: Pulmonary effort is normal. No respiratory distress.     Breath sounds: Normal breath sounds. No wheezing.  Abdominal:     General: Bowel sounds are normal. There is no distension.     Palpations: Abdomen is soft.     Tenderness: There is no abdominal tenderness.  Musculoskeletal:     Cervical back: Normal range of motion and neck supple.     Right lower leg: No edema.     Left lower leg: No edema.  Comments: RLE exam deferred.  No overt swelling or malrotation  Skin:    General: Skin is warm and dry.  Neurological:     Mental Status: She is alert. Mental status is at baseline.  Psychiatric:        Mood and Affect: Mood normal.      Consultants:  Orthopedic surgery  Procedures:    Data Reviewed: Results for orders placed or performed during the hospital encounter of 05/19/24 (from the past 24 hours)  CBC with Differential     Status: Abnormal   Collection Time: 05/19/24  8:33 PM  Result Value Ref Range   WBC 12.8 (H) 4.0 - 10.5 K/uL    RBC 4.58 3.87 - 5.11 MIL/uL   Hemoglobin 14.1 12.0 - 15.0 g/dL   HCT 55.4 63.9 - 53.9 %   MCV 97.2 80.0 - 100.0 fL   MCH 30.8 26.0 - 34.0 pg   MCHC 31.7 30.0 - 36.0 g/dL   RDW 87.0 88.4 - 84.4 %   Platelets 410 (H) 150 - 400 K/uL   nRBC 0.0 0.0 - 0.2 %   Neutrophils Relative % 81 %   Neutro Abs 10.5 (H) 1.7 - 7.7 K/uL   Lymphocytes Relative 10 %   Lymphs Abs 1.2 0.7 - 4.0 K/uL   Monocytes Relative 7 %   Monocytes Absolute 0.9 0.1 - 1.0 K/uL   Eosinophils Relative 1 %   Eosinophils Absolute 0.1 0.0 - 0.5 K/uL   Basophils Relative 1 %   Basophils Absolute 0.1 0.0 - 0.1 K/uL   Immature Granulocytes 0 %   Abs Immature Granulocytes 0.04 0.00 - 0.07 K/uL  Basic metabolic panel     Status: Abnormal   Collection Time: 05/19/24  8:33 PM  Result Value Ref Range   Sodium 143 135 - 145 mmol/L   Potassium 3.7 3.5 - 5.1 mmol/L   Chloride 103 98 - 111 mmol/L   CO2 27 22 - 32 mmol/L   Glucose, Bld 108 (H) 70 - 99 mg/dL   BUN 23 8 - 23 mg/dL   Creatinine, Ser 9.42 0.44 - 1.00 mg/dL   Calcium 89.4 (H) 8.9 - 10.3 mg/dL   GFR, Estimated >39 >39 mL/min   Anion gap 12 5 - 15  CK     Status: None   Collection Time: 05/19/24  8:48 PM  Result Value Ref Range   Total CK 51 38 - 234 U/L  Magnesium     Status: None   Collection Time: 05/19/24  8:48 PM  Result Value Ref Range   Magnesium 2.0 1.7 - 2.4 mg/dL  Phosphorus     Status: Abnormal   Collection Time: 05/19/24  8:48 PM  Result Value Ref Range   Phosphorus 2.4 (L) 2.5 - 4.6 mg/dL  Hepatic function panel     Status: None   Collection Time: 05/19/24  8:48 PM  Result Value Ref Range   Total Protein 6.9 6.5 - 8.1 g/dL   Albumin 3.6 3.5 - 5.0 g/dL   AST 20 15 - 41 U/L   ALT 7 0 - 44 U/L   Alkaline Phosphatase 100 38 - 126 U/L   Total Bilirubin 0.6 0.0 - 1.2 mg/dL   Bilirubin, Direct 0.2 0.0 - 0.2 mg/dL   Indirect Bilirubin 0.4 0.3 - 0.9 mg/dL  Hemoglobin J8r     Status: Abnormal   Collection Time: 05/19/24  8:51 PM  Result Value Ref  Range   Hgb A1c MFr Bld 5.8 (  H) 4.8 - 5.6 %   Mean Plasma Glucose 119.76 mg/dL  I-Stat CG4 Lactic Acid, ED     Status: None   Collection Time: 05/19/24  8:56 PM  Result Value Ref Range   Lactic Acid, Venous 1.5 0.5 - 1.9 mmol/L  CBG monitoring, ED     Status: Abnormal   Collection Time: 05/19/24  9:42 PM  Result Value Ref Range   Glucose-Capillary 100 (H) 70 - 99 mg/dL  CBG monitoring, ED     Status: None   Collection Time: 05/20/24 12:36 AM  Result Value Ref Range   Glucose-Capillary 87 70 - 99 mg/dL  Type and screen     Status: None   Collection Time: 05/20/24 10:04 AM  Result Value Ref Range   ABO/RH(D) A POS    Antibody Screen NEG    Sample Expiration      05/23/2024,2359 Performed at [facility], [address], [place], [place] [address]   CBC     Status: None   Collection Time: 05/20/24 10:05 AM  Result Value Ref Range   WBC 8.8 4.0 - 10.5 K/uL   RBC 4.16 3.87 - 5.11 MIL/uL   Hemoglobin 13.1 12.0 - 15.0 g/dL   HCT 60.3 63.9 - 53.9 %   MCV 95.2 80.0 - 100.0 fL   MCH 31.5 26.0 - 34.0 pg   MCHC 33.1 30.0 - 36.0 g/dL   RDW 86.9 88.4 - 84.4 %   Platelets 349 150 - 400 K/uL   nRBC 0.0 0.0 - 0.2 %  Basic metabolic panel     Status: Abnormal   Collection Time: 05/20/24 10:05 AM  Result Value Ref Range   Sodium 140 135 - 145 mmol/L   Potassium 3.3 (L) 3.5 - 5.1 mmol/L   Chloride 101 98 - 111 mmol/L   CO2 27 22 - 32 mmol/L   Glucose, Bld 87 70 - 99 mg/dL   BUN 19 8 - 23 mg/dL   Creatinine, Ser 9.36 0.44 - 1.00 mg/dL   Calcium 9.7 8.9 - 89.6 mg/dL   GFR, Estimated >39 >39 mL/min   Anion gap 12 5 - 15  VITAMIN D 25 Hydroxy (Vit-D Deficiency, Fractures)     Status: Abnormal   Collection Time: 05/20/24 10:05 AM  Result Value Ref Range   Vit D, 25-Hydroxy 26.74 (L) 30 - 100 ng/mL  ABO/Rh     Status: None   Collection Time: 05/20/24 10:08 AM  Result Value Ref Range   ABO/RH(D)      A POS Performed at [facility], [address], [place], [place]  [address]     I have reviewed pertinent nursing notes, vitals, labs, and images as necessary. I have ordered labwork to follow up on as indicated.  I have reviewed the last notes from staff over past 24 hours. I have discussed patient's care plan and test results with nursing staff, CM/SW, and other staff as appropriate.  Time spent: Greater than 50% of the 55 minute visit was spent in counseling/coordination of care for the patient as laid out in the A&P.   LOS: 1 day   Alm Apo, MD [facility] 05/20/2024, 11:25 AM

## 2024-05-20 NOTE — Assessment & Plan Note (Signed)
-   per MRI right hip: Acute right intertrochanteric hip fracture without displacement, with surrounding marrow and regional soft tissue edema. - continue pain control - Orthopedic surgery following with plans for operative repair today - Keep n.p.o.  - PT/OT evals after surgery -Aspirin planned for prophylaxis postop per orthopedics
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
## 2024-05-20 NOTE — Progress Notes (Signed)
 Orthopedic Updated Plan   Spoke with patient and grandson, [name], this afternoon on the telephone at [phone].  He states that his mother has died and that since his grandfather died yesterday, he is next of kin for the patient.  Since the patient was lacking capacity this morning, I discussed her fracture with him.  I covered the risks, benefits, alternatives of operative fixation for this fracture.  After our conversation, he elected to proceed with nonoperative management.  He said it is important for the patient to make it to her husband's funeral and that would be her wish.  She would not want to undergo surgery.  I explained that there is a risk of the fracture displacing and her being unable to weight-bear and having to undergo a slightly more difficult surgery as a result.  He again expressed desire to pursue nonoperative treatment.  Accordingly, I will recommend touchdown weight-bear on the right side.  She is 89 years old so she will likely do more than that since it is hard for her 89 year old to balance on 1 leg even with a walker.  She will work with physical therapy.  She can have a diet.  Okay for DVT prophylaxis.  We will continue to treated nonoperatively.  I will see her in the office in 2 weeks time to get repeat x-rays.   Ozell DELENA Ada, MD Orthopedic Surgeon

## 2024-05-20 NOTE — Progress Notes (Signed)
 Chaplain responded to pt's request for visit from Chaplain.  Pt alert and responsive, reporting her husband had died just a day or so ago.  Chaplain engaged in ministry of presence, listening as pt talked about their life together, how they were both teenagers when they married, how each night they would lie in bed together and talk over the day's event. Chaplain affirmed pt's belief that her husband could hear her even now when she talks to him.  Now that he's gone, pt requested prayer to let God know she was ready to come home to be with Him and her husband and to take her as soon as he could.  Chaplain prayed at bedside for just these things.  [place] Chaplain

## 2024-05-20 NOTE — Consult Note (Signed)
 WOC Nurse Consult Note:  Closed hip fracture; trauma  Reason for Consult:wound to buttocks, in between bilateral toes  Wound type: discoloration to bilateral feet/toes, Buttock;Sacrum Pressure Injury POA: Yes Measurement: 10 x 12 cm Wound azi:jmzjd of maroon-red discoloration, some dry sites Drainage none Periwound: erythema MASD buttock; gluteal cleft Dressing procedure/placement/frequency: Assess skin every shift Recommend Skin Set Orders Mepilex foam dressing sacrum, change every 3 days or PRN. Apply moisture cream or lotion to bilateral feet BID  Applied Sacrum foam dressing to buttock and 4x4 foam dressings over heels, off-load heels at all times onto pillows.  05/20/24  Buttock      WOC will follow patient and see within 10 days.  Please reconsult if wound worsens in condition and notify provider.   Sherrilyn Hals MSN RN CWOCN WOC [facility]  [phone] (Available from 7-3 pm Mon-Friday)

## 2024-05-20 NOTE — Progress Notes (Signed)
 Initial Nutrition Assessment  DOCUMENTATION CODES:   Severe malnutrition in context of chronic illness  INTERVENTION:  Continue Regular diet, change to not room service appropriate  Order Ensure Plus High Protein po BID,  each supplement provides 350 kcal and 20 grams of protein. Continue to monitor electrolytes, patient at risk for refeeding. Order Phos and Mg x3 days Start MVI w minerals daily and 1000 units Vitamin D PO daily, low level on 9/30  NUTRITION DIAGNOSIS:   Severe Malnutrition related to chronic illness as evidenced by severe muscle depletion, severe fat depletion.   GOAL:   Patient will meet greater than or equal to 90% of their needs    MONITOR:   PO intake, Supplement acceptance  REASON FOR ASSESSMENT:   Consult Assessment of nutrition requirement/status (Hip/Femur fracture patient)  ASSESSMENT:   PMH dementia, HTN, HLD. Presented after a mechanical fall.  Patient seen sleeping in room, easily arousble. Patient with dementia at baseline, currently oriented to self only and unable to provide diet or weight hx. NPO for ORIF today, procedure cancelled and regular diet ordered. Severe fat and muscle wasting noted on NFPE, patient meet ASPEN criteria for malnutrition.   Meds reviwed. Labs: K 3.3  Phos 2.4 (9/29)  Vitamin D 26.74  NUTRITION - FOCUSED PHYSICAL EXAM:  Flowsheet Row Most Recent Value  Orbital Region Severe depletion  Upper Arm Region Severe depletion  Thoracic and Lumbar Region Severe depletion  Buccal Region Severe depletion  Temple Region Severe depletion  Clavicle Bone Region Severe depletion  Clavicle and Acromion Bone Region Severe depletion  Scapular Bone Region Severe depletion  Dorsal Hand Severe depletion  Patellar Region Severe depletion  Anterior Thigh Region Severe depletion  Posterior Calf Region Severe depletion  Hair Reviewed  Eyes Reviewed  Mouth Reviewed  Skin Reviewed  Nails Reviewed    Diet Order:   Diet  Order             Diet NPO time specified Except for: Sips with Meds  Diet effective now                   EDUCATION NEEDS:   Not appropriate for education at this time  Skin:  Skin Assessment: Skin Integrity Issues: Skin Integrity Issues:: Other (Comment) Other: MASD buttock; gluteal cleft. 10 x 12 cm  Last BM:  PTA  Height:   Ht Readings from Last 1 Encounters:  05/20/24 5' (1.524 m)    Weight:   Wt Readings from Last 1 Encounters:  05/20/24 35.5 kg    Ideal Body Weight:  45.5 kg  BMI:  Body mass index is 15.28 kg/m.  Estimated Nutritional Needs:   Kcal:  8934-8579 kcal  Protein:  53-64 grams  Fluid:  1L/d   Ishika Chesterfield, MS, RD, LDN Clinical Dietitian  Please see AMiON for contact information.

## 2024-05-20 NOTE — ED Notes (Addendum)
 Report called and given to [facility], Charity fundraiser

## 2024-05-20 NOTE — ED Notes (Signed)
 [facility] called and transportation set up for Pt transfer.

## 2024-05-20 NOTE — ED Notes (Addendum)
 This RN attempted to call 5N x1 to give report on Pt. No answer at this time. Will attempt again.

## 2024-05-21 ENCOUNTER — Encounter (HOSPITAL_COMMUNITY): Payer: Self-pay | Admitting: Internal Medicine

## 2024-05-21 DIAGNOSIS — S72144A Nondisplaced intertrochanteric fracture of right femur, initial encounter for closed fracture: Secondary | ICD-10-CM | POA: Diagnosis not present

## 2024-05-21 LAB — GLUCOSE, CAPILLARY
Glucose-Capillary: 128 mg/dL — ABNORMAL HIGH (ref 70–99)
Glucose-Capillary: 214 mg/dL — ABNORMAL HIGH (ref 70–99)
Glucose-Capillary: 95 mg/dL (ref 70–99)
Glucose-Capillary: 96 mg/dL (ref 70–99)

## 2024-05-21 LAB — BASIC METABOLIC PANEL WITH GFR
Anion gap: 11 (ref 5–15)
BUN: 22 mg/dL (ref 8–23)
CO2: 27 mmol/L (ref 22–32)
Calcium: 9.6 mg/dL (ref 8.9–10.3)
Chloride: 100 mmol/L (ref 98–111)
Creatinine, Ser: 0.54 mg/dL (ref 0.44–1.00)
GFR, Estimated: >60 mL/min (ref >60)
Glucose, Bld: 94 mg/dL (ref 70–99)
Potassium: 3 mmol/L — ABNORMAL LOW (ref 3.5–5.1)
Sodium: 138 mmol/L (ref 135–145)

## 2024-05-21 LAB — PHOSPHORUS: Phosphorus: 2.3 mg/dL — ABNORMAL LOW (ref 2.5–4.6)

## 2024-05-21 LAB — MAGNESIUM: Magnesium: 1.8 mg/dL (ref 1.7–2.4)

## 2024-05-21 MED ORDER — OXYCODONE HCL 5 MG PO TABS
5.0000 mg | ORAL_TABLET | ORAL | Status: DC | PRN
  Administered 2024-05-23 – 2024-06-03 (×11): 5 mg via ORAL
  Filled 2024-05-21 (×14): qty 1

## 2024-05-21 MED ORDER — POTASSIUM CHLORIDE CRYS ER 20 MEQ PO TBCR
40.0000 meq | EXTENDED_RELEASE_TABLET | ORAL | Status: AC
  Administered 2024-05-21 (×2): 40 meq via ORAL
  Filled 2024-05-21 (×2): qty 2

## 2024-05-21 MED ORDER — ACETAMINOPHEN 500 MG PO TABS
1000.0000 mg | ORAL_TABLET | Freq: Three times a day (TID) | ORAL | Status: DC
  Administered 2024-05-21 – 2024-05-28 (×20): 1000 mg via ORAL
  Filled 2024-05-21 (×20): qty 2

## 2024-05-21 MED ORDER — MORPHINE SULFATE (PF) 2 MG/ML IV SOLN: 0.5000 mg | INTRAVENOUS | Status: DC | PRN

## 2024-05-21 NOTE — Plan of Care (Signed)

## 2024-05-21 NOTE — Progress Notes (Signed)
 [facility] Progress Note Patient: Theresa Day FMW:[id number] DOB: [birth year]  DOA: 05/19/2024 DOS: the patient was seen and examined on 05/21/2024  Brief Hospital Course: Theresa Day is a 89 yo female with PMH dementia, HTN, HLD who presented after a mechanical fall.  Fall had taken place approximately 1 week prior to admission.  She had tripped and fallen on her right knee. Multiple imaging studies performed on admission in the ER.  Ultimately found to have acute right intertrochanteric hip fracture without displacement. Orthopedic surgery consulted on admission.    Assessment and Plan: Closed nondisplaced intertrochanteric fracture of right femur (HCC) per MRI right hip: Acute right intertrochanteric hip fracture without displacement, with surrounding marrow and regional soft tissue edema. Orthopedic surgery consulted. Currently nonweightbearing. PT OT consulted.   Dementia without behavioral disturbance (HCC) - Mood stable.  Cooperative   Hyperlipidemia - Noted.  Advanced age and no mortality benefit with treatment   Essential hypertension - Controlled without medication currently  Underweight. Severe protein calorie malnutrition. Body mass index is 15.28 kg/m.  Placing the patient at a higher level, Continue supplementation.  Bilateral buttock injury. Deep tissue injury present on admission.  Continue foam dressing.  Subjective: Pain well-controlled.  No nausea no vomiting no fever no chills.  Physical Exam: Clear to auscultation. S1-S2 present Bowel sound present.  Data Reviewed: I have Reviewed nursing notes, Vitals, and Lab results. Since last encounter, pertinent lab results CBC and BMP   . I have ordered test including CBC and BMP  . I have discussed pt's care plan and test results with orthopedics  .   Disposition: Status is: Inpatient Remains inpatient appropriate because: Monitor for pain control and further workup  SCDs Start: 05/20/24  0415   Family Communication: No one at bedside. Level of care: Med-Surg   Vitals:   05/20/24 1605 05/20/24 1929 05/21/24 0855 05/21/24 1559  BP: (!) 116/46 (!) 144/51 (!) 131/48 (!) 107/50  Pulse: 76 67 94 90  Resp: 15 17 16 16   Temp: 97.9 F (36.6 C) 97.7 F (36.5 C) 97.8 F (36.6 C) 97.8 F (36.6 C)  TempSrc: Oral Oral    SpO2: 100% 99% 98% 99%  Weight:      Height:         Author: Yetta Blanch, MD 05/21/2024 8:06 PM  Please look on [email or web address] to find out who is on call.

## 2024-05-21 NOTE — Evaluation (Signed)
 Physical Therapy Evaluation Patient Details Name: Theresa Day MRN: [id number] DOB: 11-20-1929 Today's Date: 05/21/2024  History of Present Illness  Theresa Day is a 89 y.o. female admitted 05/19/24 after mechanical fall sustaining closed nondisplaced intertrochanteric fracture of right femur. Family elected for nonoperative management. PMHx: dementia without behavioral disturbance, HTN, HLD.   Clinical Impression  Pt admitted with above diagnosis. Examination limited by impaired cognition secondary to dementia, pt being a poor historian, and lack of family present. Attempt to call pt's grandson with no answer. Unable to obtain PLOF or home set-up. Pt currently with functional limitations due to the deficits listed below (see PT Problem List). She required modA for bed mobility and min-modA for transfers using RW. Pt was unable to abide by weight-bearing restriction throughout mobility. She is currently limited by pain, RLE TDWB, decreased safety awareness, generalized weakness, impaired balance, and reduced activity tolerance. Pt will benefit from acute skilled PT to increase her independence and safety with mobility to allow discharge. Recommend continued inpatient follow up therapy, <3 hours/day.    If plan is discharge home, recommend the following: A lot of help with walking and/or transfers;A lot of help with bathing/dressing/bathroom;Assistance with cooking/housework;Assist for transportation;Help with stairs or ramp for entrance;Supervision due to cognitive status;Direct supervision/assist for medications management   Can travel by private vehicle   No    Equipment Recommendations Wheelchair (measurements PT);Wheelchair cushion (measurements PT);BSC/3in1;Rolling walker (2 wheels)  Recommendations for Other Services       Functional Status Assessment Patient has had a recent decline in their functional status and demonstrates the ability to make significant improvements in  function in a reasonable and predictable amount of time.     Precautions / Restrictions Precautions Precautions: Fall Recall of Precautions/Restrictions: Impaired Precaution/Restrictions Comments: Educated pt on weight-bearing status, she was unable to abide by restriction during mobility despite multi-modal cueing and repeated education.      Mobility  Bed Mobility Overal bed mobility: Needs Assistance Bed Mobility: Supine to Sit     Supine to sit: Mod assist, HOB elevated     General bed mobility comments: Pt sat up on L side of bed with increased time. Assist to manage BLE and elevate trunk. Use of bed pad to scoot pt fwd to edge.    Transfers Overall transfer level: Needs assistance Equipment used: Rolling walker (2 wheels) Transfers: Sit to/from Stand, Bed to chair/wheelchair/BSC Sit to Stand: Min assist   Step pivot transfers: Min assist, Mod assist       General transfer comment: Pt stood from lowest bed height. Cued proper hand placement. Powered up with minA. Transferred to recliner chair on left. Good eccentric control.    Ambulation/Gait Ambulation/Gait assistance: Min assist, Mod assist Gait Distance (Feet): 3 Feet Assistive device: Rolling walker (2 wheels) Gait Pattern/deviations: Step-to pattern Gait velocity: decr     General Gait Details: Pt took short slow steps to transfer to recliner chair. Pt appeared to accept full weight-bearing on RLE, educated her to maintain TDWB and PT placed foot under pt's R heel to attempt to limit weight acceptance. Step by step cues for sequencing and assist to manuever RW.  Stairs            Wheelchair Mobility     Tilt Bed    Modified Rankin (Stroke Patients Only)       Balance Overall balance assessment: Needs assistance Sitting-balance support: Bilateral upper extremity supported, Feet supported Sitting balance-Leahy Scale: Fair Sitting balance - Comments: Pt  sat EOB with close supervision-CGA.    Standing balance support: Bilateral upper extremity supported, During functional activity, Reliant on assistive device for balance Standing balance-Leahy Scale: Poor Standing balance comment: Pt dependent on RW and min-modA from PT.                             Pertinent Vitals/Pain Pain Assessment Pain Assessment: PAINAD Breathing: occasional labored breathing, short period of hyperventilation Negative Vocalization: occasional moan/groan, low speech, negative/disapproving quality Facial Expression: sad, frightened, frown Body Language: tense, distressed pacing, fidgeting Consolability: distracted or reassured by voice/touch PAINAD Score: 5 Pain Intervention(s): Monitored during session, Limited activity within patient's tolerance, Repositioned    Home Living Family/patient expects to be discharged to:: Private residence Living Arrangements: Alone Available Help at Discharge: Family;Available PRN/intermittently (Grandson) Type of Home: House             Additional Comments: Pt reports her husband just recently passed away (~1 week ago?)    Prior Function Prior Level of Function : Patient poor historian/Family not available (Attempted to call pt's grandson to clarify PLOF and home set-up, but no answer.)             Mobility Comments: Ambulatory. At least 1 fall leading to current admission.       Extremity/Trunk Assessment   Upper Extremity Assessment Upper Extremity Assessment: Defer to OT evaluation    Lower Extremity Assessment Lower Extremity Assessment: Generalized weakness;RLE deficits/detail RLE Deficits / Details: Pt with right femur fx. Limited hip AROM d/t pain. Knee and ankle AROM WFL. Grossly 3-/5 strength. RLE: Unable to fully assess due to pain RLE Coordination: decreased gross motor    Cervical / Trunk Assessment Cervical / Trunk Assessment: Other exceptions Cervical / Trunk Exceptions: Fragility  Communication    Communication Communication: No apparent difficulties    Cognition Arousal: Alert Behavior During Therapy: Flat affect, Anxious   PT - Cognitive impairments: History of cognitive impairments, Orientation (Dementia)   Orientation impairments: Place, Time, Situation                   PT - Cognition Comments: Pt nervous to mobilize d/t pain. She was A,O to self only reporting her full name and DOB. Following commands: Impaired Following commands impaired: Follows one step commands inconsistently, Follows one step commands with increased time     Cueing Cueing Techniques: Verbal cues, Gestural cues     General Comments General comments (skin integrity, edema, etc.): VSS on RA    Exercises     Assessment/Plan    PT Assessment Patient needs continued PT services  PT Problem List Decreased strength;Decreased range of motion;Decreased activity tolerance;Decreased balance;Decreased mobility;Decreased cognition;Decreased knowledge of use of DME;Decreased safety awareness;Decreased knowledge of precautions;Pain       PT Treatment Interventions DME instruction;Gait training;Stair training;Functional mobility training;Therapeutic activities;Therapeutic exercise;Balance training;Cognitive remediation;Patient/family education;Wheelchair mobility training    PT Goals (Current goals can be found in the Care Plan section)  Acute Rehab PT Goals PT Goal Formulation: Patient unable to participate in goal setting Time For Goal Achievement: 06/04/24 Potential to Achieve Goals: Fair    Frequency Min 2X/week     Co-evaluation               AM-PAC PT 6 Clicks Mobility  Outcome Measure Help needed turning from your back to your side while in a flat bed without using bedrails?: A Lot Help needed moving from lying on your back  to sitting on the side of a flat bed without using bedrails?: A Lot Help needed moving to and from a bed to a chair (including a wheelchair)?: A Lot Help  needed standing up from a chair using your arms (e.g., wheelchair or bedside chair)?: A Lot Help needed to walk in hospital room?: A Lot Help needed climbing 3-5 steps with a railing? : Total 6 Click Score: 11    End of Session Equipment Utilized During Treatment: Gait belt Activity Tolerance: Patient tolerated treatment well;Patient limited by pain;Patient limited by fatigue Patient left: in bed;with call bell/phone within reach;with chair alarm set Nurse Communication: Mobility status PT Visit Diagnosis: Difficulty in walking, not elsewhere classified (R26.2);Other abnormalities of gait and mobility (R26.89);Unsteadiness on feet (R26.81);Pain Pain - Right/Left: Right Pain - part of body: Hip    Time: 9245-9188 PT Time Calculation (min) (ACUTE ONLY): 17 min   Charges:   PT Evaluation $PT Eval Moderate Complexity: 1 Mod   PT General Charges $$ ACUTE PT VISIT: 1 Visit         Randall SAUNDERS, PT, DPT Acute Rehabilitation Services Office: [phone] Secure Chat Preferred  Delon CHRISTELLA Callander 05/21/2024, 8:57 AM

## 2024-05-21 NOTE — TOC Initial Note (Signed)
 Transition of Care [facility]) - Initial/Assessment Note    Patient Details  Name: Theresa Day MRN: [id number] Date of Birth: 1930/06/11  [facility]) CM/SW Contact:    Bridget Cordella Simmonds, LCSW Phone Number: 05/21/2024, 12:51 PM  Clinical Narrative:    Pt oriented x2, able to give short responses to CSW questions but not really provide any information.  CSW had received call from Ms [name]/[place] [facility] several days ago stating there is open APS case with this pt and concerns with the care she has received from her grandson.  CSW spoke with [name] again today and receive more info: 2 APS reports made with concerns for very dirty house.  Both pt and her husband were in the home with  grandson [name].  Husband died on May 28, 2024 and then it came to light that pt had fallen a week earlier and no medical care was obtained.  Grandson initially was not responsive to APS but has since engaged with them, is making some progress on the house.  APS is scheduled to be in court this Friday 10/3 and is requesting a protective order where they will become the medical decision maker.  [place] is not aware of this.                 Expected Discharge Plan: Skilled Nursing Facility Barriers to Discharge: Continued Medical Work up, SNF Pending bed offer, Other (must enter comment) (open APS case)   Patient Goals and CMS Choice            Expected Discharge Plan and Services In-house Referral: Clinical Social Work     Living arrangements for the past 2 months: Single Family Home                                      Prior Living Arrangements/Services Living arrangements for the past 2 months: Single Family Home Lives with:: Relatives (with grandson)                   Activities of Daily Living   ADL Screening (condition at time of admission) Independently performs ADLs?: No Does the patient have a NEW difficulty with bathing/dressing/toileting/self-feeding that is  expected to last >3 days?: Yes (Initiates electronic notice to provider for possible OT consult) Does the patient have a NEW difficulty with getting in/out of bed, walking, or climbing stairs that is expected to last >3 days?: Yes (Initiates electronic notice to provider for possible PT consult) Does the patient have a NEW difficulty with communication that is expected to last >3 days?: No Is the patient deaf or have difficulty hearing?: No Does the patient have difficulty seeing, even when wearing glasses/contacts?: No Does the patient have difficulty concentrating, remembering, or making decisions?: Yes  Permission Sought/Granted                  Emotional Assessment Appearance:: Appears stated age Attitude/Demeanor/Rapport: Unable to Assess Affect (typically observed): Unable to Assess Orientation: : Oriented to Self, Oriented to Place      Admission diagnosis:  Closed right hip fracture (HCC) [S72.001A] Closed nondisplaced fracture of greater trochanter of right femur, initial encounter (HCC) [S72.114A] Fall, initial encounter [W19.XXXA] Patient Active Problem List   Diagnosis Date Noted   Dementia without behavioral disturbance (HCC) 05/20/2024   Protein-calorie malnutrition, severe 05/20/2024   Closed nondisplaced intertrochanteric fracture of right femur (HCC) May 28, 2024  Essential hypertension 05/19/2024   Hyperlipidemia 05/19/2024   PCP:  Nena Rosina CROME, PA-C Pharmacy:   [facility] [phone] - [place], [place] - [address] [place] [address] [place] [address] [place] [address] Phone: [phone] Fax: [phone]  [facility] - [place], [place] - [address] [place] [address] Phone: [phone] Fax: [phone]     Social Drivers of Health (SDOH) Social History: SDOH Screenings   Food Insecurity: Patient Unable To Answer (05/21/2024)  Housing: Unknown (05/21/2024)  Transportation Needs: No Transportation  Needs (05/21/2024)  Utilities: Patient Unable To Answer (05/21/2024)  Financial Resource Strain: High Risk (05/15/2023)   Received from [facility]  Physical Activity: Inactive (06/29/2021)   Received from [facility]  Social Connections: Patient Unable To Answer (05/21/2024)  Stress: No Stress Concern Present (06/29/2021)   Received from [facility]  Tobacco Use: Low Risk  (05/21/2024)   SDOH Interventions:     Readmission Risk Interventions     No data to display

## 2024-05-22 DIAGNOSIS — S72144A Nondisplaced intertrochanteric fracture of right femur, initial encounter for closed fracture: Secondary | ICD-10-CM | POA: Diagnosis not present

## 2024-05-22 LAB — GLUCOSE, CAPILLARY
Glucose-Capillary: 100 mg/dL — ABNORMAL HIGH (ref 70–99)
Glucose-Capillary: 85 mg/dL (ref 70–99)
Glucose-Capillary: 127 mg/dL — ABNORMAL HIGH (ref 70–99)
Glucose-Capillary: 132 mg/dL — ABNORMAL HIGH (ref 70–99)
Glucose-Capillary: 140 mg/dL — ABNORMAL HIGH (ref 70–99)

## 2024-05-22 LAB — BASIC METABOLIC PANEL WITH GFR
Anion gap: 9 (ref 5–15)
BUN: 25 mg/dL — ABNORMAL HIGH (ref 8–23)
CO2: 25 mmol/L (ref 22–32)
Calcium: 9.5 mg/dL (ref 8.9–10.3)
Chloride: 104 mmol/L (ref 98–111)
Creatinine, Ser: 0.6 mg/dL (ref 0.44–1.00)
GFR, Estimated: >60 mL/min (ref >60)
Glucose, Bld: 86 mg/dL (ref 70–99)
Potassium: 4.1 mmol/L (ref 3.5–5.1)
Sodium: 138 mmol/L (ref 135–145)

## 2024-05-22 LAB — MAGNESIUM: Magnesium: 1.8 mg/dL (ref 1.7–2.4)

## 2024-05-22 MED ORDER — CHLORHEXIDINE GLUCONATE CLOTH 2 % EX PADS
6.0000 | MEDICATED_PAD | Freq: Every day | CUTANEOUS | Status: DC
  Administered 2024-05-22 – 2024-06-03 (×13): 6 via TOPICAL

## 2024-05-22 NOTE — Evaluation (Signed)
 Occupational Therapy Evaluation Patient Details Name: Theresa Day MRN: [id number] DOB: 1929-12-16 Today's Date: 05/22/2024   History of Present Illness   Theresa Day is a 89 y.o. female admitted 05/19/24 after mechanical fall sustaining closed nondisplaced intertrochanteric fracture of right femur. Family elected for nonoperative management. PMHx: dementia without behavioral disturbance, HTN, HLD.     Clinical Impressions Pt reports being independent in self care, mobility and meal prep prior to admission. She reports her grandson assisted with getting the mail and putting out garbage. She is aware her husband recently died. Attempted to call grandson to confirm, no answer. Pt presents with disorientation to all, but herself, R hip pain, generalized weakness, poor standing balance and inability to maintain weight bearing restriction. She transferred to EOB and on and off BSC with moderate assistance. She needs min to total assist for ADLs. Patient will benefit from continued inpatient follow up therapy, <3 hours/day.     If plan is discharge home, recommend the following:   A lot of help with walking and/or transfers;A lot of help with bathing/dressing/bathroom;Assistance with cooking/housework;Assistance with feeding;Direct supervision/assist for medications management;Direct supervision/assist for financial management;Assist for transportation;Help with stairs or ramp for entrance     Functional Status Assessment   Patient has had a recent decline in their functional status and demonstrates the ability to make significant improvements in function in a reasonable and predictable amount of time.     Equipment Recommendations   Other (comment) (defer)     Recommendations for Other Services         Precautions/Restrictions   Precautions Precautions: Fall Recall of Precautions/Restrictions: Impaired Precaution/Restrictions Comments: unable to maintain touchdown or  NWB Restrictions Weight Bearing Restrictions Per Provider Order: Yes RLE Weight Bearing Per Provider Order: Non weight bearing (NWB in orders, TDWB in ortho note)     Mobility Bed Mobility Overal bed mobility: Needs Assistance Bed Mobility: Supine to Sit, Sit to Supine     Supine to sit: Mod assist, HOB elevated Sit to supine: Mod assist   General bed mobility comments: assist for R LE, to raise trunk and for LEs back into bed    Transfers Overall transfer level: Needs assistance Equipment used: Rolling walker (2 wheels) Transfers: Sit to/from Stand, Bed to chair/wheelchair/BSC Sit to Stand: Min assist     Step pivot transfers: Mod assist     General transfer comment: face to face transfer with pt support of therapist's forearms, pt unable to minimize or off load weight on R LE      Balance Overall balance assessment: Needs assistance   Sitting balance-Leahy Scale: Fair Sitting balance - Comments: supervision   Standing balance support: Bilateral upper extremity supported, During functional activity, Reliant on assistive device for balance Standing balance-Leahy Scale: Poor Standing balance comment: dependent on min to mod assist                           ADL either performed or assessed with clinical judgement   ADL Overall ADL's : Needs assistance/impaired Eating/Feeding: Minimal assistance;Bed level Eating/Feeding Details (indicate cue type and reason): assisted to cut food and open containers Grooming: Wash/dry hands;Wash/dry face;Sitting;Supervision/safety   Upper Body Bathing: Minimal assistance;Sitting   Lower Body Bathing: Total assistance;Sit to/from stand   Upper Body Dressing : Minimal assistance;Sitting   Lower Body Dressing: Total assistance;Sit to/from stand   Toilet Transfer: Moderate assistance;Stand-pivot;BSC/3in1   Toileting- Clothing Manipulation and Hygiene: Set up;Sitting/lateral lean  Vision Baseline  Vision/History: 1 Wears glasses Ability to See in Adequate Light: 0 Adequate Patient Visual Report: No change from baseline       Perception         Praxis         Pertinent Vitals/Pain Pain Assessment Pain Assessment: Faces Faces Pain Scale: Hurts even more Pain Location: R hip Pain Descriptors / Indicators: Grimacing, Guarding, Moaning Pain Intervention(s): Repositioned, Limited activity within patient's tolerance, Monitored during session     Extremity/Trunk Assessment Upper Extremity Assessment Upper Extremity Assessment: Generalized weakness;Right hand dominant   Lower Extremity Assessment Lower Extremity Assessment: Defer to PT evaluation   Cervical / Trunk Assessment Cervical / Trunk Assessment: Kyphotic   Communication Communication Communication: Impaired Factors Affecting Communication: Hearing impaired   Cognition Arousal: Alert Behavior During Therapy: Flat affect, Anxious Cognition: No family/caregiver present to determine baseline             OT - Cognition Comments: pt only oriented to herself, surprised when told she was in the hospital and had broken her hip, repeatedly asking how long it will take to heal                 Following commands: Impaired Following commands impaired: Follows one step commands inconsistently, Follows one step commands with increased time     Cueing  General Comments   Cueing Techniques: Verbal cues;Gestural cues      Exercises     Shoulder Instructions      Home Living Family/patient expects to be discharged to:: Private residence Living Arrangements: Alone Available Help at Discharge: Family;Available PRN/intermittently (Grandson) Type of Home: House                       Home Equipment: Agricultural consultant (2 wheels)   Additional Comments: pt is aware her husband recently died, poor historian      Prior Functioning/Environment Prior Level of Function : Needs assist              Mobility Comments: reports walking without AD ADLs Comments: independent in self care and meal prep, grandson helped with putting out garbage    OT Problem List: Decreased strength;Impaired balance (sitting and/or standing);Pain;Decreased cognition;Decreased knowledge of precautions   OT Treatment/Interventions: Self-care/ADL training;DME and/or AE instruction;Therapeutic activities;Patient/family education;Balance training      OT Goals(Current goals can be found in the care plan section)   Acute Rehab OT Goals OT Goal Formulation: With patient Time For Goal Achievement: 06/05/24 Potential to Achieve Goals: Fair ADL Goals Pt Will Perform Grooming: with supervision;sitting Pt Will Perform Upper Body Dressing: with supervision;sitting Pt Will Transfer to Toilet: with min assist;stand pivot transfer;bedside commode Pt Will Perform Toileting - Clothing Manipulation and hygiene: with min assist;sit to/from stand Additional ADL Goal #1: Pt will complete bed mobility with min assist in preparation for ADLs.   OT Frequency:  Min 2X/week    Co-evaluation              AM-PAC OT 6 Clicks Daily Activity     Outcome Measure Help from another person eating meals?: A Little Help from another person taking care of personal grooming?: A Little Help from another person toileting, which includes using toliet, bedpan, or urinal?: A Lot Help from another person bathing (including washing, rinsing, drying)?: A Lot Help from another person to put on and taking off regular upper body clothing?: A Little Help from another person to put on and taking off regular  lower body clothing?: Total 6 Click Score: 14   End of Session Equipment Utilized During Treatment: Gait belt  Activity Tolerance: Patient limited by pain Patient left: in bed;with call bell/phone within reach;with bed alarm set  OT Visit Diagnosis: Unsteadiness on feet (R26.81);Pain;Muscle weakness (generalized) (M62.81);Other  symptoms and signs involving cognitive function Pain - Right/Left: Right Pain - part of body: Hip                Time: 1215-1231 OT Time Calculation (min): 16 min Charges:  OT General Charges $OT Visit: 1 Visit OT Evaluation $OT Eval Moderate Complexity: 1 Mod  Kennth Mliss Helling 05/22/2024, 1:43 PM Mliss HERO, OTR/L Acute Rehabilitation Services Office: [phone]

## 2024-05-22 NOTE — Progress Notes (Signed)
 Transition of Care [facility]) - CAGE-AID Screening   Patient Details  Name: Theresa Day MRN: [id number] Date of Birth: October 27, 1929  [facility] of Care [facility]) CM/SW Contact:    Bernardino Mayotte, RN Phone Number: 05/22/2024, 4:40 AM   Clinical Narrative:  Patient denies the use of alcohol and illicit drugs. Resources not given at this time.  CAGE-AID Screening:    Have You Ever Felt You Ought to Cut Down on Your Drinking or Drug Use?: No Have People Annoyed You By Critizing Your Drinking Or Drug Use?: No Have You Felt Bad Or Guilty About Your Drinking Or Drug Use?: No Have You Ever Had a Drink or Used Drugs First Thing In The Morning to Steady Your Nerves or to Get Rid of a Hangover?: No CAGE-AID Score: 0  Substance Abuse Education Offered: No

## 2024-05-22 NOTE — Plan of Care (Signed)
  Problem: Coping: Goal: Ability to adjust to condition or change in health will improve Outcome: Progressing   Problem: Metabolic: Goal: Ability to maintain appropriate glucose levels will improve Outcome: Progressing   Problem: Nutritional: Goal: Maintenance of adequate nutrition will improve Outcome: Progressing Goal: Progress toward achieving an optimal weight will improve Outcome: Progressing   Problem: Skin Integrity: Goal: Risk for impaired skin integrity will decrease Outcome: Progressing

## 2024-05-22 NOTE — Progress Notes (Signed)
 [facility] Progress Note Patient: Theresa Day FMW:[id number] DOB: 20-Feb-1930  DOA: 05/19/2024 DOS: the patient was seen and examined on 05/22/2024  Brief Hospital Course: Theresa Day is a 89 yo female with PMH dementia, HTN, HLD who presented after a mechanical fall.  Fall had taken place approximately 1 week prior to admission.  She had tripped and fallen on her right knee. Multiple imaging studies performed on admission in the ER.  Ultimately found to have acute right intertrochanteric hip fracture without displacement. Orthopedic surgery consulted on admission.     Assessment and Plan: Closed nondisplaced intertrochanteric fracture of right femur (HCC) per MRI right hip: Acute right intertrochanteric hip fracture without displacement, with surrounding marrow and regional soft tissue edema. Orthopedic surgery consulted. Currently nonweightbearing. PT OT consulted.   Dementia without behavioral disturbance (HCC) - Mood stable.  Cooperative   Hyperlipidemia - Noted.  Advanced age and no mortality benefit with treatment   Essential hypertension - Controlled without medication currently   Underweight. Severe protein calorie malnutrition. Body mass index is 15.28 kg/m.  Placing the patient at a higher level, Continue supplementation.   Bilateral buttock injury. Deep tissue injury present on admission.  Continue foam dressing.   Subjective: No nausea no vomiting.  No chills.  Pain well-controlled.  Unable to urinate completely today.  Required a Foley catheter.  Physical Exam: Clear to auscultation. S1-S2 present Bowel sound present  Data Reviewed: I have Reviewed nursing notes, Vitals, and Lab results. Since last encounter, pertinent lab results CBC and BMP   . I have ordered test including BMP and magnesium  .   Disposition: Status is: Inpatient Remains inpatient appropriate because: Monitor for improvement in pain control.  Awaiting placement.  SCDs  Start: 05/20/24 0415   Family Communication: No one at bedside. Level of care: Med-Surg   Vitals:   05/21/24 1559 05/21/24 2022 05/22/24 0829 05/22/24 1451  BP: (!) 107/50 (!) 97/57 (!) 125/90 (!) 123/55  Pulse: 90 89 81 92  Resp: 16 15 17 16   Temp: 97.8 F (36.6 C) 98.2 F (36.8 C) 97.7 F (36.5 C) 97.8 F (36.6 C)  TempSrc:      SpO2: 99% 100% 100% 99%  Weight:      Height:         Author: Yetta Blanch, MD 05/22/2024 6:09 PM  Please look on [email or web address] to find out who is on call.

## 2024-05-23 DIAGNOSIS — S72144A Nondisplaced intertrochanteric fracture of right femur, initial encounter for closed fracture: Secondary | ICD-10-CM | POA: Diagnosis not present

## 2024-05-23 LAB — BASIC METABOLIC PANEL WITH GFR
Anion gap: 9 (ref 5–15)
BUN: 25 mg/dL — ABNORMAL HIGH (ref 8–23)
CO2: 26 mmol/L (ref 22–32)
Calcium: 9.6 mg/dL (ref 8.9–10.3)
Chloride: 102 mmol/L (ref 98–111)
Creatinine, Ser: 0.54 mg/dL (ref 0.44–1.00)
GFR, Estimated: >60 mL/min (ref >60)
Glucose, Bld: 112 mg/dL — ABNORMAL HIGH (ref 70–99)
Potassium: 4 mmol/L (ref 3.5–5.1)
Sodium: 137 mmol/L (ref 135–145)

## 2024-05-23 LAB — GLUCOSE, CAPILLARY
Glucose-Capillary: 109 mg/dL — ABNORMAL HIGH (ref 70–99)
Glucose-Capillary: 140 mg/dL — ABNORMAL HIGH (ref 70–99)
Glucose-Capillary: 97 mg/dL (ref 70–99)

## 2024-05-23 LAB — MAGNESIUM: Magnesium: 2 mg/dL (ref 1.7–2.4)

## 2024-05-23 MED ORDER — VITAMIN D (ERGOCALCIFEROL) 1.25 MG (50000 UNIT) PO CAPS
50000.0000 [IU] | ORAL_CAPSULE | ORAL | Status: DC
  Administered 2024-05-23 – 2024-05-30 (×2): 50000 [IU] via ORAL
  Filled 2024-05-23 (×2): qty 1

## 2024-05-23 NOTE — Plan of Care (Signed)
   Problem: Pain Managment: Goal: General experience of comfort will improve and/or be controlled Outcome: Progressing   Problem: Skin Integrity: Goal: Risk for impaired skin integrity will decrease Outcome: Progressing
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
## 2024-05-23 NOTE — Progress Notes (Signed)
 Physical Therapy Treatment Patient Details Name: Theresa Day MRN: [id number] DOB: 03-Dec-1929 Today's Date: 05/23/2024   History of Present Illness Theresa Day is a 89 y.o. female admitted 05/19/24 after mechanical fall sustaining closed nondisplaced intertrochanteric fracture of right femur. Family elected for nonoperative management. PMHx: dementia without behavioral disturbance, HTN, HLD.    PT Comments  Pt greeted supine in bed, pleasant and agreeable to PT session. She requires increased time to complete all mobility. Pt perseverated on the healing timeline of her R hip. She stated  I don't think I am ever going to get better. This pain will never go away. Pt experienced mild emotional lability varying between relaxed, peaceful and depressed, crying. Offered consolement and re-directed pt to engage in therapy session. She completed sit<>stand x3 reps using RW with minA x2. Pt maintained static stance for a maximum of ~20 seconds before requesting to sit. Pt declined to transfer to recliner chair d/t fear of falling out of chair while alone in room. Attempted to reassure and educate pt. Returned pt to bed and set it in chair position. Will continue to follow acutely and advance appropriately.     If plan is discharge home, recommend the following: A lot of help with bathing/dressing/bathroom;Assistance with cooking/housework;Assist for transportation;Help with stairs or ramp for entrance;Supervision due to cognitive status;Direct supervision/assist for medications management;Two people to help with walking and/or transfers   Can travel by private vehicle     No  Equipment Recommendations  Wheelchair (measurements PT);Wheelchair cushion (measurements PT);BSC/3in1;Rolling walker (2 wheels)    Recommendations for Other Services       Precautions / Restrictions Precautions Precautions: Fall Recall of Precautions/Restrictions: Impaired Precaution/Restrictions Comments: Unable to  maintain weight-bearing status on RLE during mobility. Restrictions Weight Bearing Restrictions Per Provider Order: Yes RLE Weight Bearing Per Provider Order: Non weight bearing (NWB in orders; TDWB in Ortho note)     Mobility  Bed Mobility Overal bed mobility: Needs Assistance Bed Mobility: Supine to Sit, Sit to Supine     Supine to sit: HOB elevated, Min assist, +2 for physical assistance Sit to supine: Min assist, +2 for physical assistance   General bed mobility comments: Pt sat up on L side of bed with increased time. She slowly brought LE toward EOB, with assist to manage RLE. Assist to raise trunk and scoot pt fwd with use of bed pad. Returning to bed pt required assist to bring BLE in. Repositioned using bed features and bed pad.    Transfers Overall transfer level: Needs assistance Equipment used: Rolling walker (2 wheels) Transfers: Sit to/from Stand Sit to Stand: Min assist, +2 physical assistance           General transfer comment: Pt stood from lowest bed height. Cued proper hand placement using RW. Powered up with minA x2. Kept RLE anterior and put pt's R foot on top of PT's foot in order to limit the amount of weight-acceptance. Pt demonstrated posterior bias in standing. Provided stability, cued increase WBing in BUE support on RW, and facilitated weight-shift to LLE. Pt completed sit<>stand x3 reps. She maintained static stance for ~20sec, requesting to sit d/t worsening pain. Pt declined to transfer to recliner chair as she doesn't want to be sitting up alone, d/t fear of falling. Educated and reassured, but pt continued to decline.    Ambulation/Gait             Pre-gait activities: Instructed pt on side stepping along EOB and/or to pivot on  L foot. She was unable to advance L foot while abiding by weight-bearing restriction on RLE. Deferred further attempts.     Stairs             Wheelchair Mobility     Tilt Bed    Modified Rankin (Stroke  Patients Only)       Balance Overall balance assessment: Needs assistance Sitting-balance support: Bilateral upper extremity supported, Feet supported Sitting balance-Leahy Scale: Fair Sitting balance - Comments: supervision   Standing balance support: Bilateral upper extremity supported, During functional activity, Reliant on assistive device for balance Standing balance-Leahy Scale: Poor Standing balance comment: Pt dependent on RW and +2 assist                            Communication Communication Communication: Impaired Factors Affecting Communication: Hearing impaired  Cognition Arousal: Alert Behavior During Therapy: Flat affect, Anxious, Lability   PT - Cognitive impairments: History of cognitive impairments (Dementia)                       PT - Cognition Comments: Pt perserverated on feeling like she would never heal. Emotional lability from calm/restful to crying/moaning, but unable to explain/describe what she is feeling when prompted. Following commands: Impaired Following commands impaired: Follows one step commands inconsistently, Follows one step commands with increased time    Cueing Cueing Techniques: Verbal cues, Gestural cues  Exercises      General Comments General comments (skin integrity, edema, etc.): MD and RN entered during session. Updated them on pt reporting I just want to die and I feel like just giving up.      Pertinent Vitals/Pain Pain Assessment Pain Assessment: PAINAD Breathing: occasional labored breathing, short period of hyperventilation Negative Vocalization: occasional moan/groan, low speech, negative/disapproving quality Facial Expression: sad, frightened, frown Body Language: tense, distressed pacing, fidgeting Consolability: distracted or reassured by voice/touch PAINAD Score: 5 Pain Location: R hip Pain Descriptors / Indicators: Grimacing, Guarding, Moaning, Discomfort, Aching Pain Intervention(s):  Monitored during session, Limited activity within patient's tolerance, Repositioned    Home Living                          Prior Function            PT Goals (current goals can now be found in the care plan section) Acute Rehab PT Goals Patient Stated Goal: Have less pain PT Goal Formulation: Patient unable to participate in goal setting Time For Goal Achievement: 06/04/24 Potential to Achieve Goals: Fair Progress towards PT goals: Progressing toward goals    Frequency    Min 2X/week      PT Plan      Co-evaluation              AM-PAC PT 6 Clicks Mobility   Outcome Measure  Help needed turning from your back to your side while in a flat bed without using bedrails?: Total Help needed moving from lying on your back to sitting on the side of a flat bed without using bedrails?: Total Help needed moving to and from a bed to a chair (including a wheelchair)?: Total Help needed standing up from a chair using your arms (e.g., wheelchair or bedside chair)?: Total Help needed to walk in hospital room?: Total Help needed climbing 3-5 steps with a railing? : Total 6 Click Score: 6    End of Session Equipment Utilized  During Treatment: Gait belt Activity Tolerance: Patient tolerated treatment well;Patient limited by pain Patient left: in bed;with call bell/phone within reach;with bed alarm set Nurse Communication: Mobility status;Other (comment) (Request for spiritual consult) PT Visit Diagnosis: Difficulty in walking, not elsewhere classified (R26.2);Other abnormalities of gait and mobility (R26.89);Unsteadiness on feet (R26.81);Pain Pain - Right/Left: Right Pain - part of body: Hip     Time: 8648-8578 PT Time Calculation (min) (ACUTE ONLY): 30 min  Charges:    $Therapeutic Activity: 23-37 mins PT General Charges $$ ACUTE PT VISIT: 1 Visit                     Randall SAUNDERS, PT, DPT Acute Rehabilitation Services Office: [phone] Secure Chat  Preferred  Delon CHRISTELLA Callander 05/23/2024, 3:15 PM

## 2024-05-23 NOTE — Progress Notes (Signed)
 Orthopedic Surgery Note   Patient is a 89 year old female who had a ground-level fall and sustained a right intertrochanteric femur fracture.  This fracture was demonstrated on a MRI scan on 04/23/2024.  The recommended treatment for this fracture would be operative stabilization with a cephalomedullary rod. The risks of this procedure included, but were not limited to: nonunion, malunion, hardware failure/malposition, infection, bleeding, stiffness, screw cut out, need for additional procedures, deep vein thrombosis, pulmonary embolism, and death. The benefits of this procedure would be to promote fracture healing by providing stability and to allow for early mobilization. The alternatives of this surgery would be to treat the fracture with bedrest or to do no intervention.    Ozell DELENA Ada, MD Orthopedic Surgeon

## 2024-05-23 NOTE — TOC Progression Note (Addendum)
 Transition of Care [facility]) - Progression Note    Patient Details  Name: Theresa Day MRN: [id number] Date of Birth: 1929-08-30  [facility] CM/SW Contact  Bridget Cordella Simmonds, LCSW Phone Number: 05/23/2024, 11:27 AM  Clinical Narrative:   CSW spoke with Ms [name]/[place] [facility].  The protective order was granted this AM in court, she is waiting for a copy and will email it to CSW once she receives it.  She will also inform grandson of this order.  She would like the MD and/or surgeon to email her regarding the recommended surgery so she can provide this to her leadership. [email or web address]  MD Tobie and Georgina updated.   1515: Court order received and placed on chart.   CSW spoke with [name]/APS, discussed that grandson is asking to be able to take pt to her husband's funeral tomorrow.  Per [name], they cannot approve this based on the concerns expressed about the grandson's care, pt needs to remain in the hospital while her medical situation is addressed.  [name] will contact the grandson now, inform him of the court order, and inform him that pt cannot leave the hospital for the funeral.   Expected Discharge Plan: Skilled Nursing Facility Barriers to Discharge: Continued Medical Work up, SNF Pending bed offer, Other (must enter comment) (open APS case)               Expected Discharge Plan and Services In-house Referral: Clinical Social Work     Living arrangements for the past 2 months: Single Family Home                                       Social Drivers of Health (SDOH) Interventions SDOH Screenings   Food Insecurity: Patient Unable To Answer (05/21/2024)  Housing: Unknown (05/21/2024)  Transportation Needs: No Transportation Needs (05/21/2024)  Utilities: Patient Unable To Answer (05/21/2024)  Financial Resource Strain: High Risk (05/15/2023)   Received from [facility]  Physical Activity: Inactive (06/29/2021)   Received from [facility]  Social Connections: Patient Unable To Answer (05/21/2024)  Stress: No Stress Concern Present (06/29/2021)   Received from [facility]  Tobacco Use: Low Risk  (05/21/2024)    Readmission Risk Interventions     No data to display

## 2024-05-23 NOTE — Care Management Important Message (Signed)
 Important Message  Patient Details  Name: Theresa Day MRN: [id number] Date of Birth: Apr 09, 1930   Important Message Given:  Yes - Medicare IM     Jon Cruel 05/23/2024, 8:36 AM

## 2024-05-24 DIAGNOSIS — S72144A Nondisplaced intertrochanteric fracture of right femur, initial encounter for closed fracture: Secondary | ICD-10-CM | POA: Diagnosis not present

## 2024-05-24 LAB — CBC
HCT: 35.1 % — ABNORMAL LOW (ref 36.0–46.0)
Hemoglobin: 11.6 g/dL — ABNORMAL LOW (ref 12.0–15.0)
MCH: 31.3 pg (ref 26.0–34.0)
MCHC: 33 g/dL (ref 30.0–36.0)
MCV: 94.6 fL (ref 80.0–100.0)
Platelets: 318 K/uL (ref 150–400)
RBC: 3.71 MIL/uL — ABNORMAL LOW (ref 3.87–5.11)
RDW: 13.2 % (ref 11.5–15.5)
WBC: 6.3 K/uL (ref 4.0–10.5)
nRBC: 0 % (ref 0.0–0.2)

## 2024-05-24 LAB — BASIC METABOLIC PANEL WITH GFR
Anion gap: 6 (ref 5–15)
BUN: 22 mg/dL (ref 8–23)
CO2: 26 mmol/L (ref 22–32)
Calcium: 9.4 mg/dL (ref 8.9–10.3)
Chloride: 104 mmol/L (ref 98–111)
Creatinine, Ser: 0.59 mg/dL (ref 0.44–1.00)
GFR, Estimated: >60 mL/min (ref >60)
Glucose, Bld: 96 mg/dL (ref 70–99)
Potassium: 4.4 mmol/L (ref 3.5–5.1)
Sodium: 136 mmol/L (ref 135–145)

## 2024-05-24 LAB — MAGNESIUM: Magnesium: 1.9 mg/dL (ref 1.7–2.4)

## 2024-05-24 LAB — GLUCOSE, CAPILLARY
Glucose-Capillary: 109 mg/dL — ABNORMAL HIGH (ref 70–99)
Glucose-Capillary: 116 mg/dL — ABNORMAL HIGH (ref 70–99)
Glucose-Capillary: 121 mg/dL — ABNORMAL HIGH (ref 70–99)

## 2024-05-24 MED ORDER — MIRTAZAPINE 15 MG PO TABS
7.5000 mg | ORAL_TABLET | Freq: Every day | ORAL | Status: DC
  Administered 2024-05-24 – 2024-06-02 (×10): 7.5 mg via ORAL
  Filled 2024-05-24 (×10): qty 1

## 2024-05-24 NOTE — Progress Notes (Signed)
 Pts son visited with his girlfriend at bedside. The sons girlfriend was primarily asking questions about pts VS/surgery update and is concerned/displeased with how pt has been treated at home. She also voiced a lot of concerns about pts estate and how it is willed to the grandson. The girlfriend has requested her phone number be the main form of communication for updates on pt as grandsons rights are being taken away.  The girlfriend also requested that the surgery be done Monday to repair the injury when at bedside, leading to pt getting upset and stating the sons girlfriend can go have surgery on herself, and I would not be forced to do something I don't want to do.  Pts fear is that she or someone is going to make her have a surgery. Pt states wanting to go to God with her husband. Pt has stated multiple times throughout the day that she does not want to have surgery, and has asked multiple times about when her grandson [name] would be here to visit. Pt has requested chaplain at the bedside. Chaplain consulted and is currently at the bedside.   Sidenote: At what point do we look at the ethics of this case?

## 2024-05-24 NOTE — Plan of Care (Signed)
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
## 2024-05-24 NOTE — Progress Notes (Signed)
   05/24/24 1948  Spiritual Encounters  Type of Visit Initial  Care provided to: Pt and family  Conversation partners present during encounter Nurse  Reason for visit Routine spiritual support  Spiritual Framework  Presenting Themes Impactful experiences and emotions  Community/Connection Family  Patient Stress Factors Loss of control;Health changes;Lack of knowledge;Exhausted;Major life changes  Family Stress Factors Lack of caregivers;Health changes   Upon arrival, chaplain met with Pt after first speaking with the charge nurse, who informed chaplain that there were significant family dynamics occurring. Pt is a 89 year old female, present in the room at the time of arrival were the Pt's son, daughter and son's girlfriend  With respect an sensitivity, chaplain asked the family members to step outside so that the Pt and chaplain could speak privately without external influence on the Pt's expressed wishes.  During the visit, Pt shared that her husband passed away earlier today and she was  unable to attend his funeral. She reflected on their life together, sharing that they me as teenagers while working at the same job and were married ever since. Pt reported living with her grandson, who has been her primary caregiver.   Pt stated that she was hospitalized after a fall and expressed profound grief over her husband's death. She voiced spiritual concerns,seeking assurance that God would forgive her sins and that when her time came, she was ready to go home to be with Jesus.  Pt clearly stated she dot want surgery despite the family's desire for her to have the procedure.Chaplain reflected and listened empathetically to Pt's grief and spiritual questions.  The Pt appeared to be experiencing intense bereavement consistent with broken heart' syndrome. Chaplain noted ongoing tension between the Pt's expressed wishes and the family's concern for health. Chaplain will continue to provide  spiritual and emotional support to both the Pt and family as requested, while requesting the Pt's stated autonomy and values.

## 2024-05-25 DIAGNOSIS — S72144A Nondisplaced intertrochanteric fracture of right femur, initial encounter for closed fracture: Secondary | ICD-10-CM | POA: Diagnosis not present

## 2024-05-25 DIAGNOSIS — Z7189 Other specified counseling: Secondary | ICD-10-CM

## 2024-05-25 DIAGNOSIS — Z515 Encounter for palliative care: Secondary | ICD-10-CM

## 2024-05-25 LAB — MAGNESIUM: Magnesium: 2.2 mg/dL (ref 1.7–2.4)

## 2024-05-25 LAB — CBC
HCT: 38.9 % (ref 36.0–46.0)
Hemoglobin: 12.7 g/dL (ref 12.0–15.0)
MCH: 31.9 pg (ref 26.0–34.0)
MCHC: 32.6 g/dL (ref 30.0–36.0)
MCV: 97.7 fL (ref 80.0–100.0)
Platelets: 318 K/uL (ref 150–400)
RBC: 3.98 MIL/uL (ref 3.87–5.11)
RDW: 13.3 % (ref 11.5–15.5)
WBC: 7.6 K/uL (ref 4.0–10.5)
nRBC: 0 % (ref 0.0–0.2)

## 2024-05-25 LAB — GLUCOSE, CAPILLARY
Glucose-Capillary: 80 mg/dL (ref 70–99)
Glucose-Capillary: 157 mg/dL — ABNORMAL HIGH (ref 70–99)
Glucose-Capillary: 124 mg/dL — ABNORMAL HIGH (ref 70–99)

## 2024-05-25 LAB — BASIC METABOLIC PANEL WITH GFR
Anion gap: 8 (ref 5–15)
BUN: 24 mg/dL — ABNORMAL HIGH (ref 8–23)
CO2: 25 mmol/L (ref 22–32)
Calcium: 9.9 mg/dL (ref 8.9–10.3)
Chloride: 103 mmol/L (ref 98–111)
Creatinine, Ser: 0.61 mg/dL (ref 0.44–1.00)
GFR, Estimated: >60 mL/min (ref >60)
Glucose, Bld: 69 mg/dL — ABNORMAL LOW (ref 70–99)
Potassium: 4.7 mmol/L (ref 3.5–5.1)
Sodium: 136 mmol/L (ref 135–145)

## 2024-05-25 NOTE — Consult Note (Signed)
 Palliative Medicine Inpatient Consult Note  Consulting Provider: Dr. Tobie  Reason for consult:   Palliative Care Consult Services Palliative Medicine Consult  Reason for Consult? Goals of care conversation.   05/25/2024  HPI:  Per intake H&P --> Theresa Day is a 89 yo female with PMH dementia, HTN, HLD who presented after a mechanical fall.  Fall had taken place approximately 1 week prior to admission.  She had tripped and fallen on her right knee. Found to have acute right intertrochanteric hip fracture without displacement. Orthopedic surgery consulted and have explained the potential risks of operative intervention to patients family. The Palliative medicine team has been asked to support additional goals of care conversations.   Clinical Assessment/Goals of Care:  *Please note that this is a verbal dictation therefore any spelling or grammatical errors are due to the Dragon Medical One system interpretation.  I have reviewed medical records including EPIC notes, labs and imaging, received report from bedside RN, assessed the patient who is lying in bed, she is unsure of where she is or why she is here. We discussed her right femur fracture and the discussions to date. She shares with myself that she does not want surgical intervention though is limited thereafter in sharing understanding of the procedure. She than requests for her grandson to come visit her.    I spoke to patients grandson(s) [name] and [name] to further discuss diagnosis prognosis, GOC, EOL wishes, disposition and options. [name] shares that presently, Theresa Day has a guardian he shares with me this had been appointed as APS was called on him twice. He then provides an explanation as to why this was and states he plans to contest and appeal the guardianship tomorrow morning.    I introduced Palliative Medicine as specialized medical care for people living with serious illness. It focuses on providing relief from the symptoms  and stress of a serious illness. The goal is to improve quality of life for both the patient and the family.  Medical History Review and Understanding:  A review of Theresa Day's past medical history significant for dementia, hypertension, hyperlipidemia, and right hip fracture was completed.  Social History:  Theresa Day is from [place], [place] .  She had 3 children though only 1 son, Theresa Day is living.  Her son Theresa Day is apparently in poor health himself.  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  Sadly her husband passed away within the last week.  She shares the grief of losing him.  She enjoys red wine, gardening, being outside, and spending time with her family.  Theresa Day is a woman of [place] faith practicing within the [employer] [facility] denomination.  Functional and Nutritional State:  Prior to hospitalization Theresa Day was living with her grandson, Theresa Day.  He was able to help her with the majority of her B ADLs.  Theresa Day is quite frail and generally does not eat a tremendous amount.  Advance Directives:  A detailed discussion was had today regarding advanced directives.  Theresa Day does not have healthcare power of attorney documents on file.  Code Status:  Concepts specific to code status, artifical feeding and hydration, continued IV antibiotics and rehospitalization was had.  The difference between a aggressive medical intervention path  and a palliative comfort care path for this patient at this time was had.   Encouraged patient/family to consider DNR/DNI status understanding evidenced based poor outcomes in similar hospitalized patient, as the cause of arrest is likely associated with advanced chronic/terminal illness rather than an easily  reversible acute cardio-pulmonary event. I explained that DNR/DNI does not change the medical plan and it only comes into effect after a person has arrested (died).  It is a protective measure to keep us  from harming the patient in their last moments of life.     Theresa Day is an established DNR/DNI with understanding that patient would not receive CPR, defibrillation, ACLS medications, or intubation.   Discussion:  Discussions were held regarding the reasons surrounding patient's hospitalization inclusive of her remaining in bed the days prior to admission.  It is emphasized that she has extreme grief pertaining to her husband's poor health and she also has pain associated with her hip fracture.  Per her grandson she was doing too much for her husband and understood she should not do as much however continued to provide care as able.  It is thought that she perhaps cause worsening of her fracture and has also suffered from great loss.  Patient's grandsons are clear that she had not wanted surgical intervention this is also something she herself was able to tell me when I met with her.  The orthopedics team have been actively involved since hospitalization and have explained both the risks and benefits of procedures.  At this point in time additional conversations will take place with the guardian, Theresa Day tomorrow to determine the best path moving forward for Theresa Day.  The situation is complicated by patient's wishes to not have surgery although her mental state is impaired by dementia, patient's grandsons who do not have legal say at this time, and an appointed guardian.    _________  I have called patient's legal guardian though she will not be available until tomorrow therefore a detailed message was left for formal conversation in the oncoming day.  Discussed the importance of continued conversation with family and their  medical providers regarding overall plan of care and treatment options, ensuring decisions are within the context of the patients values and GOCs.  Decision Maker: Theresa Day,Theresa Day (Legal Guardian): [phone] (Mobile)   SUMMARY OF RECOMMENDATIONS   DNAR/DNI  Complicated social situation - has been appointed a guardian  [name] herself  does not want surgery - plan to speak to legal guardian, Theresa Day to further determine path forward  The orthopedics team have share that the recommended treatment for this fracture would be operative stabilization with a cephalomedullary rod   The PMT will provide ongoing support  Code Status/Advance Care Planning: DNAR/DNI  Palliative Prophylaxis:  Aspiration, Bowel Regimen, Delirium Protocol, Frequent Pain Assessment, Oral Care, Palliative Wound Care, and Turn Reposition  Additional Recommendations (Limitations, Scope, Preferences): Continue current measures  Psycho-social/Spiritual:  Desire for further Chaplaincy support: Yes - [facility] Additional Recommendations:    Prognosis:   Discharge Planning:   Vitals:   05/25/24 0410 05/25/24 0724  BP: (!) 121/59 112/62  Pulse: 65 86  Resp: 18 17  Temp: 98 F (36.7 C) 97.9 F (36.6 C)  SpO2: 100% 98%    Intake/Output Summary (Last 24 hours) at 05/25/2024 0734 Last data filed at 05/25/2024 0636 Gross per 24 hour  Intake 720 ml  Output 600 ml  Net 120 ml   Last Weight  Most recent update: 05/20/2024  6:13 AM    Weight  35.5 kg (78 lb 4.2 oz)            LABS: CBC:    Component Value Date/Time   WBC 7.6 05/25/2024 0515   HGB 12.7 05/25/2024 0515   HCT 38.9 05/25/2024 0515   PLT  318 05/25/2024 0515   MCV 97.7 05/25/2024 0515   NEUTROABS 10.5 (H) 05/19/2024 2033   LYMPHSABS 1.2 05/19/2024 2033   MONOABS 0.9 05/19/2024 2033   EOSABS 0.1 05/19/2024 2033   BASOSABS 0.1 05/19/2024 2033   Comprehensive Metabolic Panel:    Component Value Date/Time   NA 136 05/25/2024 0515   K 4.7 05/25/2024 0515   CL 103 05/25/2024 0515   CO2 25 05/25/2024 0515   BUN 24 (H) 05/25/2024 0515   CREATININE 0.61 05/25/2024 0515   GLUCOSE 69 (L) 05/25/2024 0515   CALCIUM 9.9 05/25/2024 0515   AST 20 05/19/2024 2048   ALT 7 05/19/2024 2048   ALKPHOS 100 05/19/2024 2048   BILITOT 0.6 05/19/2024 2048   PROT 6.9 05/19/2024 2048    ALBUMIN 3.6 05/19/2024 2048   Gen:  Elderly Caucasian F chronically ill appearing HEENT: Dry mucous membranes CV: Regular rate and rhythm  PULM: On RA, breathing is even and nonlabored   ABD: soft/nontender  EXT: No edema  Neuro: Alert and oriented to self  PPS: 10-20%   This conversation/these recommendations were discussed with patient primary care team, Dr. Tobie ______________________________________________________ [name] Palliative Medicine Team Team Cell Phone: [phone] Please utilize secure chat with additional questions, if there is no response within 30 minutes please call the above phone number  Total Time: 75 Billing based on MDM: High  Palliative Medicine Team providers are available by phone from 7am to 7pm daily and can be reached through the team cell phone.  Should this patient require assistance outside of these hours, please call the patient's attending physician.
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
## 2024-05-25 NOTE — Consult Note (Signed)
 WOC consulted for buttocks, see WOC consult during this admission. Wound care orders are in.  Riki Berninger [facility], CNS, CWON-AP [phone]

## 2024-05-26 ENCOUNTER — Inpatient Hospital Stay (HOSPITAL_COMMUNITY)

## 2024-05-26 DIAGNOSIS — S72144A Nondisplaced intertrochanteric fracture of right femur, initial encounter for closed fracture: Secondary | ICD-10-CM | POA: Diagnosis not present

## 2024-05-26 DIAGNOSIS — Z7189 Other specified counseling: Secondary | ICD-10-CM | POA: Diagnosis not present

## 2024-05-26 DIAGNOSIS — Z515 Encounter for palliative care: Secondary | ICD-10-CM | POA: Diagnosis not present

## 2024-05-26 LAB — CBC
HCT: 35.1 % — ABNORMAL LOW (ref 36.0–46.0)
Hemoglobin: 11.4 g/dL — ABNORMAL LOW (ref 12.0–15.0)
MCH: 31.1 pg (ref 26.0–34.0)
MCHC: 32.5 g/dL (ref 30.0–36.0)
MCV: 95.9 fL (ref 80.0–100.0)
Platelets: 326 K/uL (ref 150–400)
RBC: 3.66 MIL/uL — ABNORMAL LOW (ref 3.87–5.11)
RDW: 13.4 % (ref 11.5–15.5)
WBC: 8.2 K/uL (ref 4.0–10.5)
nRBC: 0 % (ref 0.0–0.2)

## 2024-05-26 LAB — SEDIMENTATION RATE: Sed Rate: 37 mm/h — ABNORMAL HIGH (ref 0–22)

## 2024-05-26 LAB — BASIC METABOLIC PANEL WITH GFR
Anion gap: 7 (ref 5–15)
BUN: 24 mg/dL — ABNORMAL HIGH (ref 8–23)
CO2: 26 mmol/L (ref 22–32)
Calcium: 9.6 mg/dL (ref 8.9–10.3)
Chloride: 103 mmol/L (ref 98–111)
Creatinine, Ser: 0.62 mg/dL (ref 0.44–1.00)
GFR, Estimated: >60 mL/min (ref >60)
Glucose, Bld: 92 mg/dL (ref 70–99)
Potassium: 4.1 mmol/L (ref 3.5–5.1)
Sodium: 136 mmol/L (ref 135–145)

## 2024-05-26 LAB — C-REACTIVE PROTEIN: CRP: 0.5 mg/dL (ref <1.0)

## 2024-05-26 LAB — URIC ACID: Uric Acid, Serum: 3.4 mg/dL (ref 2.5–7.1)

## 2024-05-26 LAB — MAGNESIUM: Magnesium: 1.9 mg/dL (ref 1.7–2.4)

## 2024-05-26 MED ORDER — CEFAZOLIN SODIUM-DEXTROSE 2-4 GM/100ML-% IV SOLN: 2.0000 g | INTRAVENOUS | Status: AC

## 2024-05-26 MED ORDER — COLCHICINE 0.6 MG PO TABS
0.6000 mg | ORAL_TABLET | Freq: Every day | ORAL | Status: DC
  Administered 2024-05-26 – 2024-06-02 (×8): 0.6 mg via ORAL
  Filled 2024-05-26 (×8): qty 1

## 2024-05-26 MED ORDER — MEDIHONEY WOUND/BURN DRESSING EX PSTE
1.0000 | PASTE | Freq: Every day | CUTANEOUS | Status: DC
  Administered 2024-05-27 – 2024-06-03 (×8): 1 via TOPICAL
  Filled 2024-05-26: qty 44

## 2024-05-26 MED ORDER — TRANEXAMIC ACID-NACL 1000-0.7 MG/100ML-% IV SOLN
1000.0000 mg | INTRAVENOUS | Status: AC
Start: 2024-05-26 — End: 2024-05-27

## 2024-05-26 NOTE — Progress Notes (Signed)
 Mobility Specialist Progress Note:    05/26/24 1600  Mobility  Activity Pivoted/transferred from chair to bed  Level of Assistance Moderate assist, patient does 50-74% (+2)  Assistive Device Front wheel walker  Distance Ambulated (ft) 3 ft  RLE Weight Bearing Per Provider Order NWB  Activity Response Tolerated poorly  Mobility Referral Yes  Mobility visit 1 Mobility  Mobility Specialist Start Time (ACUTE ONLY) 1510  Mobility Specialist Stop Time (ACUTE ONLY) 1522  Mobility Specialist Time Calculation (min) (ACUTE ONLY) 12 min   Pt received in bed requesting assistance to bed. Pt required ModA +2 during session. Despite Max verbal and tactile cues pt was unable to maintain NWB on RLE. Returned to bed w/ call bell and personal belongings in reach. All needs met. Bed alarm on. Family in room.   Thersia Minder Mobility Specialist  Please contact vis Secure Chat or  Rehab Office [phone]

## 2024-05-26 NOTE — Progress Notes (Addendum)
 Orthopedic Note  Saw patient this evening since it has been a while since I had last seen her.  I went over the fracture with her.  I discussed the risks, benefits, alternatives of surgery.  I covered the rationale for surgery.  After our conversation, I asked the patient to explain what injury I had diagnosed her with.  I asked her to cover some of the risks associate with the surgery.  She was unable to recall either.  She stated that she just did not want her foot amputated and did not want surgery.  She was lacking capacity at this evaluation.  The patient is in no acute distress.  She is lying in bed.  She has unlabored breathing on room air.  She perseverates about losing her foot and using a cream to try to prevent amputation of her foot.  EHL/TA/GSC intact.  Sensation intact to light touch in sural/saphenous/deep peroneal/superficial peroneal/tibial nerve distributions.  Foot warm and well-perfused.  She does have a right intertrochanteric femur fracture that is nondisplaced.  She has been nonweightbearing on it for 2 weeks now.  Given the duration that she has been in bed and not weightbearing, her ability to mobilize after surgery will be diminished and she will have a more prolonged recovery than his typical.  Accordingly, this does also diminish one of the benefits of the surgery which is early mobilization and weightbearing.  The other benefit would be to stabilize the fracture to prevent displacement and allow for weightbearing with limited risk for displacement.  Did go over these benefits with her and her grandson.  I also covered the risks of operative fixation of the fracture including but not limited to: DVT/PE, hardware failure, hardware malposition, death, need for additional procedures, malunion, nonunion, stiffness.  I explained that some of these risks are also associated with bedrest which would be the DVT/PE, malunion, nonunion, death.  There is an increased mortality risk with  prolonged bedrest.  However, the patient has already essentially been bedrest for 2 weeks.  The alternative is surgery would be to continue to have her be nonweightbearing on the right lower extremity.  She could be transferred with help to a chair but given her age and decreased muscle mass in the upper extremities, I do not think she would mobilize well without the ability to put weight on her right lower extremity.  No consent has been obtained from the patient (although she is lacking capacity), the grandson, or now the guardian so surgery has not happened.  I will continue to be available should a decision to be reached.  Theresa DELENA Ada, MD Orthopedic Surgeon   I spent 40 minutes in the room with the patient doing my exam and going over the risks, benefits, alternatives of the surgery.  I went through the several times with the patient to give her a chance to demonstrate capacity and her ability to make a decision of this magnitude.  At no point, did she demonstrate that capacity. I also used this time to go over the injury and surgery with her grandson who was at bedside. He made it clear he did not want to proceed with surgery.  He also is concerned that she will end up going to a nursing facility.  He made it clear that he wants her to go home which he feels would be her intended destination as well.  He was in the room for my capacity evaluation and tried to phrase things differently  so his grandmother could understand.  Even with his rephrasing she did not understand the rationale, pros/cons of surgical intervention.

## 2024-05-26 NOTE — Plan of Care (Signed)
  Problem: Education: Goal: Ability to describe self-care measures that may prevent or decrease complications (Diabetes Survival Skills Education) will improve Outcome: Progressing Goal: Individualized Educational Video(s) Outcome: Progressing   Problem: Coping: Goal: Ability to adjust to condition or change in health will improve Outcome: Progressing   Problem: Health Behavior/Discharge Planning: Goal: Ability to identify and utilize available resources and services will improve Outcome: Progressing Goal: Ability to manage health-related needs will improve Outcome: Progressing   Problem: Metabolic: Goal: Ability to maintain appropriate glucose levels will improve Outcome: Progressing   Problem: Nutritional: Goal: Maintenance of adequate nutrition will improve Outcome: Progressing Goal: Progress toward achieving an optimal weight will improve Outcome: Progressing   Problem: Skin Integrity: Goal: Risk for impaired skin integrity will decrease Outcome: Progressing   Problem: Education: Goal: Knowledge of General Education information will improve Description: Including pain rating scale, medication(s)/side effects and non-pharmacologic comfort measures Outcome: Progressing   Problem: Health Behavior/Discharge Planning: Goal: Ability to manage health-related needs will improve Outcome: Progressing   Problem: Clinical Measurements: Goal: Ability to maintain clinical measurements within normal limits will improve Outcome: Progressing Goal: Will remain free from infection Outcome: Progressing Goal: Diagnostic test results will improve Outcome: Progressing Goal: Respiratory complications will improve Outcome: Progressing Goal: Cardiovascular complication will be avoided Outcome: Progressing   Problem: Activity: Goal: Risk for activity intolerance will decrease Outcome: Progressing   Problem: Nutrition: Goal: Adequate nutrition will be maintained Outcome:  Progressing   Problem: Coping: Goal: Level of anxiety will decrease Outcome: Progressing   Problem: Elimination: Goal: Will not experience complications related to bowel motility Outcome: Progressing Goal: Will not experience complications related to urinary retention Outcome: Progressing   Problem: Pain Managment: Goal: General experience of comfort will improve and/or be controlled Outcome: Progressing   Problem: Safety: Goal: Ability to remain free from injury will improve Outcome: Progressing   Problem: Skin Integrity: Goal: Risk for impaired skin integrity will decrease Outcome: Progressing
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
## 2024-05-26 NOTE — Progress Notes (Signed)
 Patient visiting with grandsons [name] and [name] who came to see patient.  Darden [name] states he was the patient's power of attorney but due to a court hearing that took place today, guardianship has been temporarily given to [facility] APS/Case worker Lorene Gosling.  Patient is requesting to speak with patient's Orthopedic doctor, as he states that he was told by the caseworker that the doctor would need to give permission in order for patient to be signed out tomorrow to be able to attend her husband's funeral.  Informed grandson that unfortunately at this time the Hospitalist on call is the provider to contact for any questions/concerns as the orthopedic doctor he spoke with is not currently available.  Grandson expressed his frustration as he states that he has been calling daily requesting for a callback from the surgeon but has not received any call and unfortunately  cannot stay and wait in the hospital as he is in charge of funeral arrangements for his grandfather (patient's husband) who just passed away.  Both grandsons and other family members offering emotional support to patient as she is upset that she cannot go to the funeral.

## 2024-05-26 NOTE — Consult Note (Signed)
 WOC Nurse wound follow up Refer to previous WOC consult note on 9/30.  Pt had areas of Unstageable pressure injuries which were noted as present on admission in the bedside nurses' wound flow sheet, as well as in the photos.  There have npow evolved from dry scabbed eschar to loose slough in 3 areas across bilat buttocks/sacrum; each is approx 2X1cm with small amt tan drainage.  Previously noted Deep tissue pressure injury has resolved.  Topical treatment orders provided for bedside nurses to perform to assist with removal of nonviable tissue as follows: Apply Medihoney to 3 areas on sacrum/bilat buttocks Q day, then cover with foam dressing.  Change foam dressing Q 3 days or PRN soiling.  Please re-consult if further assistance is needed.  Thank-you,  Stephane Fought MSN, RN, CWOCN, CWCN-AP, CNS Contact Mon-Fri 0700-1500: [phone]
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
## 2024-05-26 NOTE — Progress Notes (Signed)
 This morning patient oriented to self, place and situation, asking when the doctor will be around to speak with her. Patient states she does not wish to have surgical intervention on her right femur. States she understands the healing process would be slow but she is willing to wait.  Encouraged her to discuss this with rounding Mds, as decision making for this patient is multifaceted.

## 2024-05-26 NOTE — Progress Notes (Signed)
 Physical Therapy Treatment Patient Details Name: Theresa Day MRN: [id number] DOB: [birth year] Today's Date: 05/26/2024   History of Present Illness Theresa Day is a 89 y.o. female admitted 05/19/24 after mechanical fall sustaining closed nondisplaced intertrochanteric fracture of right femur. Family elected for nonoperative management. PMHx: dementia without behavioral disturbance, HTN, HLD.    PT Comments  Pt received in bed with family present. Pt afraid to move because of pain but does so with encouragement and once moving tolerated discomfort very well. Pt able to come to EOB with +2 assist. Practiced standing to RW with RLE NWB but pt needs therapist's foot under hers to keep this WB status. Pt unable to take hops on RLE. Pivoted to recliner with +2 arm in arm assist. Expect pt will need to be at transfer to w/c level with assist until she can put wt on RLE, in part due to being unable to remember and fully comprehend WB status and in part due to weakness. Patient will benefit from continued inpatient follow up therapy, <3 hours/day. PT will continue to follow.     If plan is discharge home, recommend the following: A lot of help with bathing/dressing/bathroom;Assistance with cooking/housework;Assist for transportation;Help with stairs or ramp for entrance;Supervision due to cognitive status;Direct supervision/assist for medications management;Two people to help with walking and/or transfers   Can travel by private vehicle     No  Equipment Recommendations  Wheelchair (measurements PT);Wheelchair cushion (measurements PT);BSC/3in1;Rolling walker (2 wheels)    Recommendations for Other Services       Precautions / Restrictions Precautions Precautions: Fall Recall of Precautions/Restrictions: Impaired Precaution/Restrictions Comments: needs assist to keep RLE NWB Restrictions Weight Bearing Restrictions Per Provider Order: Yes RLE Weight Bearing Per Provider Order: Non weight  bearing     Mobility  Bed Mobility Overal bed mobility: Needs Assistance Bed Mobility: Supine to Sit, Sit to Supine     Supine to sit: HOB elevated, Min assist, +2 for physical assistance Sit to supine: Min assist, +2 for physical assistance   General bed mobility comments: Pt sat up on L side of bed with increased time. She slowly brought LE toward EOB, with assist to manage RLE. Assist to raise trunk and scoot pt fwd with use of bed pad. Returning to bed pt required assist to bring BLE in. Repositioned using bed features and bed pad.    Transfers Overall transfer level: Needs assistance Equipment used: Rolling walker (2 wheels) Transfers: Sit to/from Stand Sit to Stand: Min assist, +2 physical assistance   Step pivot transfers: Mod assist, +2 physical assistance       General transfer comment: practiced sit>stand 2x with RW and with therapist's foot under pt's R foot. Due to diffciulty keeping RLE off floor performed SPT to chair on L without RW with +2 arm in arm assist. Pt tolerated this well.    Ambulation/Gait               General Gait Details: do not anticipate pt is going to be able to ambulate any meaningful distance with RLE NWB and will be transfer level while she is NWB   Stairs             Wheelchair Mobility     Tilt Bed    Modified Rankin (Stroke Patients Only)       Balance Overall balance assessment: Needs assistance Sitting-balance support: Bilateral upper extremity supported, Feet supported Sitting balance-Leahy Scale: Fair Sitting balance - Comments: supervision   Standing  balance support: Bilateral upper extremity supported, During functional activity, Reliant on assistive device for balance Standing balance-Leahy Scale: Poor Standing balance comment: Pt dependent on RW and +2 assist                            Communication Communication Communication: Impaired Factors Affecting Communication: Hearing impaired   Cognition Arousal: Alert Behavior During Therapy: Flat affect   PT - Cognitive impairments: History of cognitive impairments (Dementia)   Orientation impairments: Place, Time, Situation                   PT - Cognition Comments: pt calm today, is afraid of feeling pain before she moves but once she gets going, tolerates well. Following commands: Impaired Following commands impaired: Follows one step commands inconsistently, Follows one step commands with increased time    Cueing Cueing Techniques: Verbal cues, Gestural cues  Exercises General Exercises - Lower Extremity Ankle Circles/Pumps: AROM, Both, 10 reps, Supine, Seated    General Comments General comments (skin integrity, edema, etc.): VSS on RA      Pertinent Vitals/Pain Pain Assessment Pain Assessment: Faces Faces Pain Scale: Hurts little more Pain Location: R hip Pain Descriptors / Indicators: Grimacing, Guarding, Discomfort, Aching Pain Intervention(s): Monitored during session, Limited activity within patient's tolerance    Home Living                          Prior Function            PT Goals (current goals can now be found in the care plan section) Acute Rehab PT Goals Patient Stated Goal: Have less pain PT Goal Formulation: Patient unable to participate in goal setting Time For Goal Achievement: 06/04/24 Potential to Achieve Goals: Fair Progress towards PT goals: Progressing toward goals    Frequency    Min 2X/week      PT Plan      Co-evaluation              AM-PAC PT 6 Clicks Mobility   Outcome Measure  Help needed turning from your back to your side while in a flat bed without using bedrails?: Total Help needed moving from lying on your back to sitting on the side of a flat bed without using bedrails?: Total Help needed moving to and from a bed to a chair (including a wheelchair)?: Total Help needed standing up from a chair using your arms (e.g., wheelchair or  bedside chair)?: Total Help needed to walk in hospital room?: Total Help needed climbing 3-5 steps with a railing? : Total 6 Click Score: 6    End of Session Equipment Utilized During Treatment: Gait belt Activity Tolerance: Patient tolerated treatment well Patient left: with call bell/phone within reach;in chair;with family/visitor present Nurse Communication: Mobility status PT Visit Diagnosis: Difficulty in walking, not elsewhere classified (R26.2);Other abnormalities of gait and mobility (R26.89);Unsteadiness on feet (R26.81);Pain Pain - Right/Left: Right Pain - part of body: Hip     Time: 8894-8862 PT Time Calculation (min) (ACUTE ONLY): 32 min  Charges:    $Therapeutic Activity: 23-37 mins PT General Charges $$ ACUTE PT VISIT: 1 Visit                     Richerd Lipoma, PT  Acute Rehab Services Secure chat preferred Office [phone]    Richerd CROME Ambriella Kitt 05/26/2024, 1:07 PM

## 2024-05-26 NOTE — Progress Notes (Signed)
 Patient visiting with grandson [name], who has been coming to visit patient constantly throughout her stay.  Patient upset that grandson is unable to spend the night with her, but understands that grandson has been arranging all end of life arrangements for her husband, who passed away on the day of patient's admission.  Patient voices her desire to avoid surgery and grandson wishes to honor grandmother's desire for non-surgical treatment.  Patient's grandson states that he had an extensive conversation with Dr. Georgina the day after admission, who told him all of the options patient had in regard to her fracture.  Darden has already lost his mother (patient's daughter) and now his grandfather (patient's husband) and fears that patient would not survive the surgery, which is why he had requested non-surgical treatment.  Emotional support provided to patient and grandson, grandson grateful for all the nursing staff and doctors who have provided patient care.  Patient has had very poor appetite following the death of her husband, grandson encouraging patient to eat and brought her favorite foods so that she would eat more.  Grandson informed patient he would be back tomorrow, 10/6 to see patient.  After grandson left, patient requesting the light be left on in her room in case grandson returns.

## 2024-05-26 NOTE — TOC Progression Note (Signed)
 Transition of Care [facility]) - Progression Note    Patient Details  Name: Theresa Day MRN: [id number] Date of Birth: 25-Aug-1929  [facility] CM/SW Contact  Bridget Cordella Simmonds, LCSW Phone Number: 05/26/2024, 10:00 AM  Clinical Narrative:   MD letter outlining recommended surgery emailed to Ms [name]/[place] APS.  Voicemail left to confirm she received.   1300: second attempt to reach [name]/APS.  1610: CSW had lengthy conversation with grandson [name] regarding multiple issues.  He is aware that [facility] APS has obtained protective court order and that this means they will make the decision on the proposed surgery.  He provided history that he has been caring for both his grandparents for over 3 years, has made attempts to follow through on their wishes regarding several medical situations that have involved a lot of complications for him, including having to quit his job recently in order to provide more care for his grandfather prior to  his passing.  There is a family member who has initiated the APS situation and he believes that is related to the land inheritance he will receive from his grandparents.  He has concerns about his grandmother not surviving this surgery but more than that he knows that she does not want the surgery and he supports her decision.  He has not spoken with MD and is hoping to do so to further discuss the proposed surgery.    Team updated by epic chat.      Expected Discharge Plan: Skilled Nursing Facility Barriers to Discharge: Continued Medical Work up, SNF Pending bed offer, Other (must enter comment) (open APS case)               Expected Discharge Plan and Services In-house Referral: Clinical Social Work     Living arrangements for the past 2 months: Single Family Home                                       Social Drivers of Health (SDOH) Interventions SDOH Screenings   Food Insecurity: Patient Unable To  Answer (05/21/2024)  Housing: Unknown (05/21/2024)  Transportation Needs: No Transportation Needs (05/21/2024)  Utilities: Patient Unable To Answer (05/21/2024)  Financial Resource Strain: High Risk (05/15/2023)   Received from [facility]  Physical Activity: Inactive (06/29/2021)   Received from [facility]  Social Connections: Patient Unable To Answer (05/21/2024)  Stress: No Stress Concern Present (06/29/2021)   Received from [facility]  Tobacco Use: Low Risk  (05/21/2024)    Readmission Risk Interventions     No data to display

## 2024-05-27 DIAGNOSIS — Z7189 Other specified counseling: Secondary | ICD-10-CM | POA: Diagnosis not present

## 2024-05-27 DIAGNOSIS — Z515 Encounter for palliative care: Secondary | ICD-10-CM | POA: Diagnosis not present

## 2024-05-27 LAB — CBC
HCT: 34.8 % — ABNORMAL LOW (ref 36.0–46.0)
Hemoglobin: 11.4 g/dL — ABNORMAL LOW (ref 12.0–15.0)
MCH: 31.7 pg (ref 26.0–34.0)
MCHC: 32.8 g/dL (ref 30.0–36.0)
MCV: 96.7 fL (ref 80.0–100.0)
Platelets: 293 K/uL (ref 150–400)
RBC: 3.6 MIL/uL — ABNORMAL LOW (ref 3.87–5.11)
RDW: 13.5 % (ref 11.5–15.5)
WBC: 7.3 K/uL (ref 4.0–10.5)
nRBC: 0 % (ref 0.0–0.2)

## 2024-05-27 LAB — BASIC METABOLIC PANEL WITH GFR
Anion gap: 7 (ref 5–15)
BUN: 23 mg/dL (ref 8–23)
CO2: 27 mmol/L (ref 22–32)
Calcium: 9.7 mg/dL (ref 8.9–10.3)
Chloride: 103 mmol/L (ref 98–111)
Creatinine, Ser: 0.59 mg/dL (ref 0.44–1.00)
GFR, Estimated: >60 mL/min (ref >60)
Glucose, Bld: 93 mg/dL (ref 70–99)
Potassium: 4.1 mmol/L (ref 3.5–5.1)
Sodium: 137 mmol/L (ref 135–145)

## 2024-05-27 LAB — GLUCOSE, CAPILLARY
Glucose-Capillary: 89 mg/dL (ref 70–99)
Glucose-Capillary: 141 mg/dL — ABNORMAL HIGH (ref 70–99)
Glucose-Capillary: 112 mg/dL — ABNORMAL HIGH (ref 70–99)

## 2024-05-27 LAB — MAGNESIUM: Magnesium: 2.2 mg/dL (ref 1.7–2.4)

## 2024-05-27 MED ORDER — SENNA 8.6 MG PO TABS
1.0000 | ORAL_TABLET | Freq: Once | ORAL | Status: AC
  Administered 2024-05-27: 8.6 mg via ORAL
  Filled 2024-05-27: qty 1
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
## 2024-05-27 NOTE — Progress Notes (Signed)
 Palliative Medicine Inpatient Follow Up Note HPI: Theresa Day is a 89 yo female with PMH dementia, HTN, HLD who presented after a mechanical fall.  Fall had taken place approximately 1 week prior to admission.  She had tripped and fallen on her right knee. Found to have acute right intertrochanteric hip fracture without displacement. Orthopedic surgery consulted and have explained the potential risks of operative intervention to patients family. The Palliative medicine team has been asked to support additional goals of care conversations.   Today's Discussion 05/27/2024  *Please note that this is a verbal dictation therefore any spelling or grammatical errors are due to the Dragon Medical One system interpretation.  Chart reviewed inclusive of vital signs, progress notes, laboratory results - BMP Na 137, K 4.1, Chl 103, CO2 27, Glu 93, BUN 23, Cr 0.59, Ca 9.7, Anion gap 7, GFR > 60, and diagnostic images.   The MSW Theresa Day and orthopaedic MD, Dr. Georgina met with patients grandson last evening.   I met with Theresa Day at bedside this morning. She is awake and aware of herself. She knows she is in the hospital. She does think the orthopaedics team came by last night to speak to she and her grandson. She shares that she will go along with the plan as determined. She shares, I hope I don't lose my leg. I again shared the plan would be  for a rod placement per the chart. She would not have a leg amputation. I acknowledged the fear she has associated with this after seeing her daughter lose both of her legs.   I spoke with Theresa Day, legal guardian this morning. She shares there has not been a determination on whether or not surgery will be pursued. She notes when a determination is made that she will call Theresa Day, MSW to inform him.   Have spoken with patients bedside RN who notes no significant concerns this morning.   Questions and concerns addressed/Palliative Support Provided.   Objective  Assessment: Vital Signs Vitals:   05/27/24 0336 05/27/24 0723  BP: (!) 119/56 127/87  Pulse: 63 92  Resp: 14 16  Temp: 97.7 F (36.5 C) 98.4 F (36.9 C)  SpO2: 98% 100%    Intake/Output Summary (Last 24 hours) at 05/27/2024 9040 Last data filed at 05/27/2024 0900 Gross per 24 hour  Intake 220 ml  Output 450 ml  Net -230 ml   Last Weight  Most recent update: 05/20/2024  6:13 AM    Weight  35.5 kg (78 lb 4.2 oz)            Gen:  Elderly Caucasian F chronically ill appearing HEENT: Dry mucous membranes CV: Regular rate and rhythm  PULM: On RA, breathing is even and nonlabored   ABD: soft/nontender  EXT: No edema  Neuro: Alert and oriented to self  SUMMARY OF RECOMMENDATIONS   DNAR/DNI   Complicated social situation - has been appointed a guardian through APS   The orthopedics team have share that the recommended treatment for this fracture would be operative stabilization with a cephalomedullary rod --> Decision from legal guardian is pending   The PMT will provide ongoing support ______________________________________________________________________________________ Theresa Day [facility] Palliative Medicine Team Team Cell Phone: [phone] Please utilize secure chat with additional questions, if there is no response within 30 minutes please call the above phone number  MDM: Moderate  Palliative Medicine Team providers are available by phone from 7am to 7pm daily and can be reached through the team cell  phone.  Should this patient require assistance outside of these hours, please call the patient's attending physician.

## 2024-05-27 NOTE — Plan of Care (Signed)
  Problem: Skin Integrity: Goal: Risk for impaired skin integrity will decrease Outcome: Not Progressing   Problem: Tissue Perfusion: Goal: Adequacy of tissue perfusion will improve Outcome: Not Progressing   Problem: Activity: Goal: Risk for activity intolerance will decrease Outcome: Not Progressing   Problem: Elimination: Goal: Will not experience complications related to bowel motility Outcome: Not Progressing   Problem: Pain Managment: Goal: General experience of comfort will improve and/or be controlled Outcome: Not Progressing   Problem: Safety: Goal: Ability to remain free from injury will improve Outcome: Not Progressing   Problem: Skin Integrity: Goal: Risk for impaired skin integrity will decrease Outcome: Not Progressing

## 2024-05-27 NOTE — TOC Progression Note (Signed)
 Transition of Care [facility]) - Progression Note    Patient Details  Name: Theresa Day MRN: [id number] Date of Birth: 11/17/1929  Transition of Care [facility] CM/SW Contact  Bridget Cordella Simmonds, LCSW Phone Number: 05/27/2024, 10:01 AM  Clinical Narrative:   Message from Ms [name]/APS: she is still waiting on decision from her leadership regarding the proposed surgery.    Expected Discharge Plan: Skilled Nursing Facility Barriers to Discharge: Continued Medical Work up, SNF Pending bed offer, Other (must enter comment) (open APS case)               Expected Discharge Plan and Services In-house Referral: Clinical Social Work     Living arrangements for the past 2 months: Single Family Home                                       Social Drivers of Health (SDOH) Interventions SDOH Screenings   Food Insecurity: Patient Unable To Answer (05/21/2024)  Housing: Unknown (05/21/2024)  Transportation Needs: No Transportation Needs (05/21/2024)  Utilities: Patient Unable To Answer (05/21/2024)  Financial Resource Strain: High Risk (05/15/2023)   Received from [facility]  Physical Activity: Inactive (06/29/2021)   Received from [facility]  Social Connections: Patient Unable To Answer (05/21/2024)  Stress: No Stress Concern Present (06/29/2021)   Received from [facility]  Tobacco Use: Low Risk  (05/21/2024)    Readmission Risk Interventions     No data to display

## 2024-05-27 NOTE — Progress Notes (Signed)
 Occupational Therapy Treatment Patient Details Name: Theresa Day MRN: [id number] DOB: 01-14-1930 Today's Date: 05/27/2024   History of present illness Theresa Day is a 89 y.o. female admitted 05/19/24 after mechanical fall sustaining closed nondisplaced intertrochanteric fracture of right femur. Family elected for nonoperative management. PMHx: dementia without behavioral disturbance, HTN, HLD.   OT comments  Pt is progressing towards goals. Pt engaged in bed mobility with Min A +2 with HOB elevated and use of bed rails. Pt engaged in sit to stand transfer with Min A +2 and minimal clearance of bed. Pt returned to sitting EOB, limited by pain, fear of falling, and inability to maintain RLE NWB. Pt provided with verbal encouragement and validation prior to second transfer attempt. Pt engaged in squat pivot transfer to recliner with Mod A +2 physical assistance and management of RLE. Pt with impaired cognition impacting safe transfer abilities and understanding of precautions. Pt provided with reorientation to time, place, and situation. Pt continues to be limited by pain, anxiety, impaired cognition, generalized weakness, and unsteadiness on feet. OT to continue per POC to facilitate progress towards functional goals. Patient will benefit from continued inpatient follow up therapy, <3 hours/day.       If plan is discharge home, recommend the following:  A lot of help with walking and/or transfers;A lot of help with bathing/dressing/bathroom;Assistance with cooking/housework;Assistance with feeding;Direct supervision/assist for medications management;Direct supervision/assist for financial management;Assist for transportation;Help with stairs or ramp for entrance   Equipment Recommendations  Other (comment) (to be determined)    Recommendations for Other Services      Precautions / Restrictions Precautions Precautions: Fall Recall of Precautions/Restrictions:  Impaired Precaution/Restrictions Comments: Needs assistance to keep RLE NWB Restrictions Weight Bearing Restrictions Per Provider Order: Yes RLE Weight Bearing Per Provider Order: Non weight bearing       Mobility Bed Mobility Overal bed mobility: Needs Assistance Bed Mobility: Supine to Sit     Supine to sit: HOB elevated, Min assist, +2 for physical assistance     General bed mobility comments: Pt sat on L side of bed with increased time and use of bed rails. Pt provided with Min A +2 for physical assistance to move BLE. Pt requiring continued verbal cues for sequencing steps and encouragement to engage in bed mobility.    Transfers Overall transfer level: Needs assistance Equipment used: Rolling walker (2 wheels) Transfers: Sit to/from Stand, Bed to chair/wheelchair/BSC Sit to Stand: Min assist, +2 physical assistance   Squat pivot transfers: Mod assist, +2 physical assistance       General transfer comment: Pt engaged in sit to stand transfer once with minimal clearance from bed. Pt encouraged to take steps towards recliner. Pt with increased posterior lean to offset pain and retraction of BUE support when anxious. Pt limited in step pivot transfer with RW d/t pain and inability to maintain RLE NWB. Pt transferred to recliner with squat pivot transfer with Mod A +2 for physical assistance to maintain NWB. Pt able to scoot back in chair with supervision.     Balance Overall balance assessment: Needs assistance Sitting-balance support: Bilateral upper extremity supported, Feet supported Sitting balance-Leahy Scale: Fair Sitting balance - Comments: Close supervision Postural control: Posterior lean Standing balance support: Bilateral upper extremity supported, During functional activity, Reliant on assistive device for balance Standing balance-Leahy Scale: Poor Standing balance comment: Pt unable to maintain safe standing balance this date. Pt limited by pain and fear of  falling.  ADL either performed or assessed with clinical judgement   ADL Overall ADL's : Needs assistance/impaired Eating/Feeding: Minimal assistance;Sitting Eating/Feeding Details (indicate cue type and reason): Assist to set up tray table, open containers, and retreive utensils. Grooming: Supervision/safety;Sitting   Upper Body Bathing: Minimal assistance;Sitting   Lower Body Bathing: Total assistance   Upper Body Dressing : Minimal assistance;Sitting   Lower Body Dressing: Total assistance   Toilet Transfer: Moderate assistance;+2 for safety/equipment           Functional mobility during ADLs: Moderate assistance;+2 for safety/equipment;Rolling walker (2 wheels) General ADL Comments: Pt attempted [facility] transfer this date, ultimately opting to transfer to recliner d/t pain and decreased activity tolerance limiting engagement. Pt required Min A feeding.    Extremity/Trunk Assessment Upper Extremity Assessment Upper Extremity Assessment: Generalized weakness            Vision   Vision Assessment?: No apparent visual deficits   Perception     Praxis     Communication Communication Communication: Impaired Factors Affecting Communication: Hearing impaired   Cognition Arousal: Alert Behavior During Therapy: Anxious Cognition: Cognition impaired   Orientation impairments: Place, Time, Situation Awareness: Intellectual awareness intact, Online awareness impaired     Executive functioning impairment (select all impairments): Problem solving, Reasoning, Sequencing, Organization OT - Cognition Comments: Pt oriented to self. Pt provided with reorientation regarding time, place, and situation. Pt is pleasant and agreeable to session but severe anxiety limits participation.                 Following commands: Impaired Following commands impaired: Follows one step commands inconsistently, Follows one step commands with increased  time      Cueing   Cueing Techniques: Verbal cues, Tactile cues, Visual cues, Gestural cues  Exercises      Shoulder Instructions       General Comments Pt with increased fear of falling limiting transfer abilities. Pt provided with vebal encouragement with minimal response. Pt repeated I don't think I will walk again. WB precautions explained to Pt with assumed minimal learning.    Pertinent Vitals/ Pain       Pain Assessment Pain Assessment: PAINAD Breathing: occasional labored breathing, short period of hyperventilation Negative Vocalization: occasional moan/groan, low speech, negative/disapproving quality Facial Expression: sad, frightened, frown Body Language: tense, distressed pacing, fidgeting Consolability: unable to console, distract or reassure PAINAD Score: 6 Pain Location: R hip Pain Descriptors / Indicators: Grimacing, Guarding, Moaning Pain Intervention(s): Monitored during session, Limited activity within patient's tolerance, Repositioned  Home Living                                          Prior Functioning/Environment              Frequency  Min 2X/week        Progress Toward Goals  OT Goals(current goals can now be found in the care plan section)  Progress towards OT goals: Progressing toward goals  Acute Rehab OT Goals OT Goal Formulation: With patient Time For Goal Achievement: 06/05/24 Potential to Achieve Goals: Fair ADL Goals Pt Will Perform Grooming: with supervision;sitting Pt Will Perform Upper Body Dressing: with supervision;sitting Pt Will Transfer to Toilet: with min assist;stand pivot transfer;bedside commode Pt Will Perform Toileting - Clothing Manipulation and hygiene: with min assist;sit to/from stand Additional ADL Goal #1: Pt will complete bed mobility with min assist in preparation for  ADLs.  Plan      Co-evaluation                 AM-PAC OT 6 Clicks Daily Activity     Outcome Measure    Help from another person eating meals?: A Little Help from another person taking care of personal grooming?: A Little Help from another person toileting, which includes using toliet, bedpan, or urinal?: Total Help from another person bathing (including washing, rinsing, drying)?: A Lot Help from another person to put on and taking off regular upper body clothing?: A Little Help from another person to put on and taking off regular lower body clothing?: Total 6 Click Score: 13    End of Session Equipment Utilized During Treatment: Gait belt  OT Visit Diagnosis: Unsteadiness on feet (R26.81);Pain;Muscle weakness (generalized) (M62.81);Other symptoms and signs involving cognitive function Pain - Right/Left: Right Pain - part of body: Hip   Activity Tolerance Patient limited by pain   Patient Left in chair;with call bell/phone within reach;with chair alarm set   Nurse Communication Mobility status        Time: 1341-1405 OT Time Calculation (min): 24 min  Charges: OT General Charges $OT Visit: 1 Visit OT Treatments $Therapeutic Activity: 23-37 mins  Maurilio CROME, OTR/L.  [facility] Acute Rehabilitation  Office: [phone]   Maurilio PARAS Taishawn Smaldone 05/27/2024, 3:04 PM

## 2024-05-28 DIAGNOSIS — S72144D Nondisplaced intertrochanteric fracture of right femur, subsequent encounter for closed fracture with routine healing: Secondary | ICD-10-CM

## 2024-05-28 LAB — CBC
HCT: 33 % — ABNORMAL LOW (ref 36.0–46.0)
Hemoglobin: 11 g/dL — ABNORMAL LOW (ref 12.0–15.0)
MCH: 31.7 pg (ref 26.0–34.0)
MCHC: 33.3 g/dL (ref 30.0–36.0)
MCV: 95.1 fL (ref 80.0–100.0)
Platelets: 282 K/uL (ref 150–400)
RBC: 3.47 MIL/uL — ABNORMAL LOW (ref 3.87–5.11)
RDW: 13.7 % (ref 11.5–15.5)
WBC: 5.5 K/uL (ref 4.0–10.5)
nRBC: 0 % (ref 0.0–0.2)

## 2024-05-28 LAB — BASIC METABOLIC PANEL WITH GFR
Anion gap: 8 (ref 5–15)
BUN: 20 mg/dL (ref 8–23)
CO2: 25 mmol/L (ref 22–32)
Calcium: 9.3 mg/dL (ref 8.9–10.3)
Chloride: 101 mmol/L (ref 98–111)
Creatinine, Ser: 0.53 mg/dL (ref 0.44–1.00)
GFR, Estimated: >60 mL/min (ref >60)
Glucose, Bld: 97 mg/dL (ref 70–99)
Potassium: 3.9 mmol/L (ref 3.5–5.1)
Sodium: 134 mmol/L — ABNORMAL LOW (ref 135–145)

## 2024-05-28 LAB — GLUCOSE, CAPILLARY
Glucose-Capillary: 107 mg/dL — ABNORMAL HIGH (ref 70–99)
Glucose-Capillary: 159 mg/dL — ABNORMAL HIGH (ref 70–99)
Glucose-Capillary: 102 mg/dL — ABNORMAL HIGH (ref 70–99)

## 2024-05-28 LAB — MAGNESIUM: Magnesium: 1.9 mg/dL (ref 1.7–2.4)

## 2024-05-28 MED ORDER — GLYCERIN (LAXATIVE) 2 G RE SUPP
1.0000 | Freq: Once | RECTAL | Status: AC
  Administered 2024-05-28: 1 via RECTAL
  Filled 2024-05-28 (×2): qty 1

## 2024-05-28 MED ORDER — ACETAMINOPHEN 500 MG PO TABS
1000.0000 mg | ORAL_TABLET | Freq: Three times a day (TID) | ORAL | Status: DC
  Administered 2024-05-28 – 2024-06-03 (×17): 1000 mg via ORAL
  Filled 2024-05-28 (×17): qty 2

## 2024-05-28 MED ORDER — SENNA 8.6 MG PO TABS
1.0000 | ORAL_TABLET | Freq: Every day | ORAL | Status: DC
  Administered 2024-05-28 – 2024-06-02 (×6): 8.6 mg via ORAL
  Filled 2024-05-28 (×6): qty 1

## 2024-05-28 NOTE — Plan of Care (Signed)
  Problem: Fluid Volume: Goal: Ability to maintain a balanced intake and output will improve Outcome: Not Progressing   Problem: Metabolic: Goal: Ability to maintain appropriate glucose levels will improve Outcome: Not Progressing   Problem: Skin Integrity: Goal: Risk for impaired skin integrity will decrease Outcome: Not Progressing   Problem: Elimination: Goal: Will not experience complications related to bowel motility Outcome: Not Progressing   Problem: Safety: Goal: Ability to remain free from injury will improve Outcome: Not Progressing
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
## 2024-05-28 NOTE — TOC Progression Note (Signed)
 Transition of Care [facility]) - Progression Note    Patient Details  Name: Theresa Day MRN: [id number] Date of Birth: 1930/06/13  Transition of [facility]) CM/SW Contact  Bridget Cordella Simmonds, LCSW Phone Number: 05/28/2024, 4:11 PM  Clinical Narrative:   ROYCE [name].  No decision on surgery.  Pt has been appointed a GAL by the court, that person will come to the hospital to speak with the pt to get pt input.  They are aware pt is confused.  APS is waiting for this to occur before making decision.    Expected Discharge Plan: Skilled Nursing Facility Barriers to Discharge: Continued Medical Work up, SNF Pending bed offer, Other (must enter comment) (open APS case)               Expected Discharge Plan and Services In-house Referral: Clinical Social Work     Living arrangements for the past 2 months: Single Family Home                                       Social Drivers of Health (SDOH) Interventions SDOH Screenings   Food Insecurity: Patient Unable To Answer (05/21/2024)  Housing: Unknown (05/21/2024)  Transportation Needs: No Transportation Needs (05/21/2024)  Utilities: Patient Unable To Answer (05/21/2024)  Financial Resource Strain: High Risk (05/15/2023)   Received from [facility]  Physical Activity: Inactive (06/29/2021)   Received from [facility]  Social Connections: Patient Unable To Answer (05/21/2024)  Stress: No Stress Concern Present (06/29/2021)   Received from [facility]  Tobacco Use: Low Risk  (05/21/2024)    Readmission Risk Interventions     No data to display

## 2024-05-28 NOTE — Progress Notes (Signed)
 Nutrition Follow Up  DOCUMENTATION CODES:   Severe malnutrition in context of chronic illness  INTERVENTION:  Encouraged adequate PO intake that helps meet calorie and protein needs   Ensure Plus High Protein po BID,  each supplement provides 350 kcal and 20 grams of protein.  Start MVI w minerals daily and 1000 units Vitamin D PO daily, low level on 9/30  NUTRITION DIAGNOSIS:   Severe Malnutrition related to chronic illness as evidenced by severe muscle depletion, severe fat depletion. Remains applicable  GOAL:   Patient will meet greater than or equal to 90% of their needs Progressing  MONITOR:   PO intake, Supplement acceptance  REASON FOR ASSESSMENT:   Consult Assessment of nutrition requirement/status (Hip/Femur fracture patient)  ASSESSMENT:   PMH dementia, HTN, HLD. Presented after a mechanical fall.  Pt sleeping in bed at time of assessment. Pt easily awoken when prompted. Pt remains confused, was unaware of breakfast tray sitting at bedside. Pt unable to report how much she has been eating. Encouraged pt to eat as much as she can and to drink ONS shake that was at bedside. Pt agrees she will try. Spoke with RN and RN states pt is able to feed herself but needs to be reminded at meal times and encouraged to eat. Average intake shows 43% intake over last 8 recorded meals.  Surgical management still recommended, but APS involved in figuring out guardianship as pt does not have capacity to make decisions. Palliative also following. Will monitor for any progress or disposition. Pt will likely need discharge to SNF.  Average Meal Completion: 10/2-10/7: 43% average intake x 8 recorded meals   Meds reviewed: Vitamin D3 1000 units daily Remeron  MVI w/ minerals Glycerin suppository   Labs: CBG x 24 hr: 89-141 mg/dL Sodium 134 Vitamin D 26.74  Diet Order:   Diet Order             DIET DYS 3 Room service appropriate? Yes; Fluid consistency: Thin  Diet  effective now                   EDUCATION NEEDS:   Not appropriate for education at this time  Skin:  Skin Assessment: Skin Integrity Issues: Skin Integrity Issues:: Other (Comment) Other: MASD buttock; gluteal cleft. 10 x 12 cm  Last BM:  10/1  Height:   Ht Readings from Last 1 Encounters:  05/20/24 5' (1.524 m)    Weight:   Wt Readings from Last 1 Encounters:  05/20/24 35.5 kg    Ideal Body Weight:  45.5 kg  BMI:  Body mass index is 15.28 kg/m.  Estimated Nutritional Needs:   Kcal:  1300-1500  Protein:  45-60g  Fluid:  1.3-1.5L   Josette Glance, MS, RDN, LDN Clinical Dietitian I Please reach out via secure chat

## 2024-05-28 NOTE — Progress Notes (Signed)
 This chaplain responded to unit page for spiritual care. The Pt. is awake and accepts the chaplain introduction and rapport building. The Pt. son-Lewis is visiting at the bedside.  The chaplain listened reflectively as the Pt. participated in story telling. 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   The chaplain learned from the Pt., it troubles me to have someone take care of me and I would have never guessed I would be in this place two years ago. The chaplain listened to the Pt. anticipatory grief in the setting of the Pt. life changes.  Today the Pt. settled on taking one day at a time and telling her story.   This chaplain is available for F/U spiritual care as needed.  Chaplain Leeroy Hummer [phone]

## 2024-05-28 NOTE — Progress Notes (Signed)
 Physical Therapy Treatment Patient Details Name: Theresa Day MRN: [id number] DOB: July 15, 1930 Today's Date: 05/28/2024   History of Present Illness Theresa Day is a 89 y.o. female admitted 05/19/24 after mechanical fall sustaining closed nondisplaced intertrochanteric fracture of right femur. Family elected for nonoperative management. PMHx: dementia without behavioral disturbance, HTN, HLD.    PT Comments  Pt continues to be limited by decreased cognition and safety awareness secondary to dementia, pain, weight-bearing status (RLE NWB), fear of falling, impaired balance, anxiety, and decreased activity tolerance. She was alert and oriented to self only. Pt acknowledges her RLE is in pain, but does not recall falling and fracturing hip nor restriction or RLE NWB. She requires min-modA x2 for bed mobility and transfers. Pt is unable to ambulate d/t inability to maintain RLE NWB. PT maintained support under pt's right foot to prevent weight-acceptance and utilized multi-modal cues to increase pt's awareness. She took increased time to complete all functional mobility and required moderate encouragement to participate. Will continue to follow acutely and advance appropriately.     If plan is discharge home, recommend the following: A lot of help with bathing/dressing/bathroom;Assistance with cooking/housework;Assist for transportation;Help with stairs or ramp for entrance;Supervision due to cognitive status;Direct supervision/assist for medications management;Two people to help with walking and/or transfers   Can travel by private vehicle     No  Equipment Recommendations  Wheelchair (measurements PT);Wheelchair cushion (measurements PT);BSC/3in1;Rolling walker (2 wheels)    Recommendations for Other Services       Precautions / Restrictions Precautions Precautions: Fall Recall of Precautions/Restrictions: Impaired Precaution/Restrictions Comments: Pt does not recall her weight-bearing  status and is unable to adhere to it during mobility despite multi-modal cues. PT supports under pt's R foot to prevent weight acceptance onto RLE. Restrictions Weight Bearing Restrictions Per Provider Order: Yes RLE Weight Bearing Per Provider Order: Non weight bearing     Mobility  Bed Mobility Overal bed mobility: Needs Assistance Bed Mobility: Supine to Sit     Supine to sit: Min assist, +2 for physical assistance     General bed mobility comments: Pt sat on L side of bed with increased time. Assist to bring BLE towards EOB. Cues for sequencing. Pt utilized bed rail with BUE. Aid of bed pad to pivot pt's hip and scoot fwd til feet flat.    Transfers Overall transfer level: Needs assistance Equipment used: 2 person hand held assist Transfers: Sit to/from Stand, Bed to chair/wheelchair/BSC Sit to Stand: Min assist, +2 physical assistance Stand pivot transfers: Mod assist, +2 physical assistance Step pivot transfers: Mod assist, +2 physical assistance       General transfer comment: Pt stood from lowest bed height. She held onto PT/[facility] upper arm. PT supported under pt's R foot to limit weight acceptance on RLE in order to maintain NWB. She transferred to [facility] on R positioned touching bed. Pt frequently attempting to take steps and get off of PT's foot in order to bear down through RLE. Cues for sequencing. Assist to pivot hips and guide pt to surface. She appears to put weight through L toes and will slightly slip. Cued flat foot contact and weight-bearing into L heel for increased stability. Transferred from BSC>recliner chair on right. Pt nervous she was going to fall and developed posterior lean. Able to guide pt safely to chair.    Ambulation/Gait               General Gait Details: Unable to ambulate while maintaining RLE  NWB. Pt transferred with 2 HHA and attempted to take steps. PT maintained support under R foot to prevent weight  acceptance.   Stairs             Wheelchair Mobility     Tilt Bed    Modified Rankin (Stroke Patients Only)       Balance Overall balance assessment: Needs assistance Sitting-balance support: Bilateral upper extremity supported, Feet supported Sitting balance-Leahy Scale: Fair Sitting balance - Comments: Close supervision Postural control: Posterior lean Standing balance support: Bilateral upper extremity supported, During functional activity, Reliant on assistive device for balance Standing balance-Leahy Scale: Poor Standing balance comment: Pt dependent on RW and unable to maintain upright posture with RLE NWB.                            Communication Communication Communication: Impaired Factors Affecting Communication: Hearing impaired  Cognition Arousal: Alert Behavior During Therapy: Anxious   PT - Cognitive impairments: History of cognitive impairments, Orientation (Dementia)   Orientation impairments: Place, Time, Situation                   PT - Cognition Comments: Pt appears to anticipate pain and will begin to cry/moan prior to performing any movement. She is fearful of falling and pain increasing. Requires moderate encouragement to participate. Offered reassurement and consolment during mobility with pt participating well.  PT provided with reorientation to time, place, and situation. Following commands: Impaired Following commands impaired: Follows one step commands inconsistently, Follows one step commands with increased time    Cueing Cueing Techniques: Verbal cues, Tactile cues, Visual cues, Gestural cues  Exercises      General Comments General comments (skin integrity, edema, etc.): VSS on RA      Pertinent Vitals/Pain Pain Assessment Pain Assessment: PAINAD Breathing: occasional labored breathing, short period of hyperventilation Negative Vocalization: occasional moan/groan, low speech, negative/disapproving  quality Facial Expression: sad, frightened, frown Body Language: tense, distressed pacing, fidgeting Consolability: distracted or reassured by voice/touch PAINAD Score: 5 Pain Location: R hip Pain Descriptors / Indicators: Grimacing, Guarding, Discomfort, Aching, Crying, Moaning Pain Intervention(s): Monitored during session, Limited activity within patient's tolerance, Repositioned    Home Living                          Prior Function            PT Goals (current goals can now be found in the care plan section) Acute Rehab PT Goals Patient Stated Goal: Feel better and have no pain PT Goal Formulation: Patient unable to participate in goal setting Time For Goal Achievement: 06/04/24 Potential to Achieve Goals: Fair Progress towards PT goals: Progressing toward goals    Frequency    Min 2X/week      PT Plan      Co-evaluation              AM-PAC PT 6 Clicks Mobility   Outcome Measure  Help needed turning from your back to your side while in a flat bed without using bedrails?: Total Help needed moving from lying on your back to sitting on the side of a flat bed without using bedrails?: Total Help needed moving to and from a bed to a chair (including a wheelchair)?: Total Help needed standing up from a chair using your arms (e.g., wheelchair or bedside chair)?: Total Help needed to walk in hospital room?: Total Help  needed climbing 3-5 steps with a railing? : Total 6 Click Score: 6    End of Session Equipment Utilized During Treatment: Gait belt Activity Tolerance: Patient tolerated treatment well Patient left: in chair;with call bell/phone within reach;with chair alarm set Nurse Communication: Mobility status;Patient requests pain meds;Other (comment) (Pt reporting she feels backed up and requesting a stool softner.) PT Visit Diagnosis: Difficulty in walking, not elsewhere classified (R26.2);Other abnormalities of gait and mobility  (R26.89);Unsteadiness on feet (R26.81);Pain Pain - Right/Left: Right Pain - part of body: Hip     Time: 1150-1210 PT Time Calculation (min) (ACUTE ONLY): 20 min  Charges:    $Therapeutic Activity: 8-22 mins PT General Charges $$ ACUTE PT VISIT: 1 Visit                     Randall SAUNDERS, PT, DPT Acute Rehabilitation Services Office: [phone] Secure Chat Preferred  Theresa Day 05/28/2024, 1:05 PM

## 2024-05-28 NOTE — Progress Notes (Addendum)
   Palliative Medicine Inpatient Follow Up Note HPI: Theresa Day is a 89 yo female with PMH dementia, HTN, HLD who presented after a mechanical fall.  Fall had taken place approximately 1 week prior to admission.  She had tripped and fallen on her right knee. Found to have acute right intertrochanteric hip fracture without displacement. Orthopedic surgery consulted and have explained the potential risks of operative intervention to patients family. The Palliative medicine team has been asked to support additional goals of care conversations.   Today's Discussion 05/28/2024  *Please note that this is a verbal dictation therefore any spelling or grammatical errors are due to the Dragon Medical One system interpretation.  Chart reviewed inclusive of vital signs, progress notes, laboratory results - BMP Na 134, K 43.9, Chl 101, CO2 25, Glu 97, BUN 20, Cr 0.53, Ca 9.3, Anion gap 8, GFR > 60, and diagnostic images.   Per patients RN, there are no significant concerns today.   I met with Theresa Day this morning and again this afternoon. She shares that she is experiencing increased pain in her RLE. She also expresses the need to have a bowel movement. I shared I would add medicine to help soften her stools. I shared she is receiving tylenol  around the clock and has additional medications as needed.  As of presently there has been no response from the guardian in relationship to whether surgery will take place or not - she has escalated this case to her leadership.   Touched base with MSW, Theresa Day this late afternoon who confirms no decision has been reached.   Questions and concerns addressed/Palliative Support Provided.   Objective Assessment: Vital Signs Vitals:   05/28/24 0737 05/28/24 1541  BP: (!) 161/68 133/88  Pulse: 84 (!) 120  Resp: 16 19  Temp: (!) 97.5 F (36.4 C) 97.6 F (36.4 C)  SpO2: 100% 98%    Intake/Output Summary (Last 24 hours) at 05/28/2024 1553 Last data filed at 05/28/2024  1357 Gross per 24 hour  Intake 120 ml  Output 400 ml  Net -280 ml   Last Weight  Most recent update: 05/20/2024  6:13 AM    Weight  35.5 kg (78 lb 4.2 oz)            Gen:  Elderly Caucasian F chronically ill appearing HEENT: Dry mucous membranes CV: Regular rate and rhythm  PULM: On RA, breathing is even and nonlabored   ABD: soft/nontender  EXT: No edema  Neuro: Alert and oriented to self  SUMMARY OF RECOMMENDATIONS   DNAR/DNI  Desire for a natural death has been placed in chart (media section)   Complicated social situation - has been appointed a guardian through [facility]   The orthopedics team have share that the recommended treatment for this fracture would be operative stabilization with a cephalomedullary rod --> Decision from legal guardian is pending  Pain: Continue tylenol  ATC, morphine as needed, oxycodone  as needed  Constipation: senna 1 tab qhs  The PMT will provide ongoing support as needed ______________________________________________________________________________________ Theresa Day Theresa Day Palliative Medicine Team Team Cell Phone: [phone] Please utilize secure chat with additional questions, if there is no response within 30 minutes please call the above phone number  MDM: Moderate  Palliative Medicine Team providers are available by phone from 7am to 7pm daily and can be reached through the team cell phone.  Should this patient require assistance outside of these hours, please call the patient's attending physician.

## 2024-05-29 DIAGNOSIS — Z7189 Other specified counseling: Secondary | ICD-10-CM | POA: Diagnosis not present

## 2024-05-29 DIAGNOSIS — Z515 Encounter for palliative care: Secondary | ICD-10-CM | POA: Diagnosis not present

## 2024-05-29 LAB — BASIC METABOLIC PANEL WITH GFR
Anion gap: 12 (ref 5–15)
BUN: 20 mg/dL (ref 8–23)
CO2: 23 mmol/L (ref 22–32)
Calcium: 9.5 mg/dL (ref 8.9–10.3)
Chloride: 104 mmol/L (ref 98–111)
Creatinine, Ser: 0.57 mg/dL (ref 0.44–1.00)
GFR, Estimated: >60 mL/min (ref >60)
Glucose, Bld: 103 mg/dL — ABNORMAL HIGH (ref 70–99)
Potassium: 3.7 mmol/L (ref 3.5–5.1)
Sodium: 139 mmol/L (ref 135–145)

## 2024-05-29 LAB — GLUCOSE, CAPILLARY
Glucose-Capillary: 89 mg/dL (ref 70–99)
Glucose-Capillary: 127 mg/dL — ABNORMAL HIGH (ref 70–99)

## 2024-05-29 NOTE — Progress Notes (Signed)
 PROGRESS NOTE  Theresa Day FMW:[id number] DOB: 06/15/1930 DOA: 05/19/2024 PCP: Nena Rosina CROME, PA-C   LOS: 10 days   Brief Narrative / Interim history: Theresa Day is a 89 yo female with PMH dementia, HTN, HLD who presented after a mechanical fall.  Fall had taken place approximately 1 week prior to admission.  She had tripped and fallen on her right knee. Multiple imaging studies performed on admission in the ER.  Ultimately found to have acute right intertrochanteric hip fracture without displacement. Orthopedic surgery consulted on admission.    Subjective / 24h Interval events: Remains confused, about to eat breakfast  Assesement and Plan: Principal problem Closed nondisplaced intertrochanteric fracture of right femur -on MRI on admission showed acute right intertrochanteric hip fracture without displacement, with surrounding marrow and regional soft tissue edema. Orthopedic surgery consulted, recommending surgery. Currently nonweightbearing.  Patient does not want surgery however she has underlying dementia and no capacity to make medical decisions.  Waiting legal guardian input   Active problems Preoperative medical evaluation - Theresa Day al. Estimated Risk Probability for Perioperative Myocardial Infarction or Cardiac Arrest: 0.5 %.  Would not recommend any additional workup at this point   Dementia without behavioral disturbance - Mood stable.  Cooperative   Hyperlipidemia - Noted.  Advanced age and no mortality benefit with treatment   Essential hypertension -on no medications, blood pressure normalized today   Severe protein calorie malnutrition - Body mass index is 15.28 kg/m. Concern for neglect.  Currently with [facility].   Bilateral buttock injury - Deep tissue injury present on admission.  Continue foam dressing.   Social issues - Patient lives with grandson, and she recently lost her husband.  [facility] involved he has guardianship for medical decision making  Depression  - Patient reported depression, no appetite.  Started on Remeron   Right ankle and knee pain - Patient reports right anterior knee pain. X-ray ankle shows evidence of soft tissue swelling. X-ray knee performed earlier in the admission shows no fracture.  There was concern for gout, colchicine was started.  Improving   Goals of care conversation - Patient has been reporting that she does not want any surgery to multiple people.  Awaiting clarity from legal guardian.  Palliative care consulted as well  Scheduled Meds:  acetaminophen   1,000 mg Oral TID   Chlorhexidine Gluconate Cloth  6 each Topical Daily   cholecalciferol  1,000 Units Oral Daily   colchicine  0.6 mg Oral Daily   feeding supplement  237 mL Oral BID BM   leptospermum manuka honey  1 Application Topical Daily   mirtazapine  7.5 mg Oral QHS   multivitamin with minerals  1 tablet Oral Daily   senna  1 tablet Oral QHS   Vitamin D (Ergocalciferol)  50,000 Units Oral Q7 days   Continuous Infusions: PRN Meds:.morphine injection, oxyCODONE   Current Outpatient Medications  Medication Instructions   acetaminophen  (TYLENOL ) 500-1,000 mg, Oral, Every 6 hours PRN   Morphine Sulfate (MORPHINE CONCENTRATE) 10 mg / 0.5 ml concentrated solution 0.25 mLs, Oral, 4 times daily PRN    Diet Orders (From admission, onward)     Start     Ordered   05/26/24 0843  DIET DYS 3 Room service appropriate? Yes; Fluid consistency: Thin  Diet effective now       Question Answer Comment  Room service appropriate? Yes   Fluid consistency: Thin      05/26/24 9157  DVT prophylaxis: SCDs Start: 05/20/24 0415   Lab Results  Component Value Date   PLT 282 05/28/2024      Code Status: Limited: Do not attempt resuscitation (DNR) -DNR-LIMITED -Do Not Intubate/DNI   Family Communication: no family at bedside   Status is: Inpatient Remains inpatient appropriate because: Severity of illness  Level of care: Med-Surg  Consultants:   Orthopedic surgery Palliative care  Objective: Vitals:   05/28/24 1541 05/28/24 1939 05/29/24 0503 05/29/24 0805  BP: 133/88 100/60 115/65 114/67  Pulse: (!) 120 97 96 79  Resp: 19 16 16 16   Temp: 97.6 F (36.4 C) 98.4 F (36.9 C) 98.3 F (36.8 C) (!) 97.4 F (36.3 C)  TempSrc:      SpO2: 98% 96% 99% 99%  Weight:      Height:        Intake/Output Summary (Last 24 hours) at 05/29/2024 1116 Last data filed at 05/28/2024 1846 Gross per 24 hour  Intake 240 ml  Output --  Net 240 ml   Wt Readings from Last 3 Encounters:  05/20/24 35.5 kg  11/07/21 47.6 kg  08/18/21 56.7 kg    Examination:  Constitutional: NAD Eyes: lids and conjunctivae normal, no scleral icterus ENMT: mmm Neck: normal, supple Respiratory: clear to auscultation bilaterally, no wheezing, no crackles.  Cardiovascular: Regular rate and rhythm, no murmurs / rubs / gallops. No LE edema. Abdomen: soft, no distention, no tenderness. Bowel sounds positive.   Data Reviewed: I have independently reviewed following labs and imaging studies   CBC Recent Labs  Lab 05/24/24 0932 05/25/24 0515 05/26/24 0702 05/27/24 0501 05/28/24 0445  WBC 6.3 7.6 8.2 7.3 5.5  HGB 11.6* 12.7 11.4* 11.4* 11.0*  HCT 35.1* 38.9 35.1* 34.8* 33.0*  PLT 318 318 326 293 282  MCV 94.6 97.7 95.9 96.7 95.1  MCH 31.3 31.9 31.1 31.7 31.7  MCHC 33.0 32.6 32.5 32.8 33.3  RDW 13.2 13.3 13.4 13.5 13.7    Recent Labs  Lab 05/24/24 0932 05/25/24 0515 05/26/24 0702 05/26/24 1419 05/27/24 0501 05/28/24 0445 05/29/24 0327  NA 136 136 136  --  137 134* 139  K 4.4 4.7 4.1  --  4.1 3.9 3.7  CL 104 103 103  --  103 101 104  CO2 26 25 26   --  27 25 23   GLUCOSE 96 69* 92  --  93 97 103*  BUN 22 24* 24*  --  23 20 20   CREATININE 0.59 0.61 0.62  --  0.59 0.53 0.57  CALCIUM 9.4 9.9 9.6  --  9.7 9.3 9.5  MG 1.9 2.2 1.9  --  2.2 1.9  --   CRP  --   --   --  0.5  --   --   --      ------------------------------------------------------------------------------------------------------------------ No results for input(s): CHOL, HDL, LDLCALC, TRIG, CHOLHDL, LDLDIRECT in the last 72 hours.  Lab Results  Component Value Date   HGBA1C 5.8 (H) 05/19/2024   ------------------------------------------------------------------------------------------------------------------ No results for input(s): TSH, T4TOTAL, T3FREE, THYROIDAB in the last 72 hours.  Invalid input(s): FREET3  Cardiac Enzymes No results for input(s): CKMB, TROPONINI, MYOGLOBIN in the last 168 hours.  Invalid input(s): CK ------------------------------------------------------------------------------------------------------------------ No results found for: BNP  CBG: Recent Labs  Lab 05/27/24 1644 05/28/24 0620 05/28/24 1211 05/28/24 1645 05/29/24 0628  GLUCAP 112* 107* 159* 102* 89    Recent Results (from the past 240 hours)  Surgical pcr screen     Status: None  Collection Time: 05/20/24 10:58 AM   Specimen: Nasal Mucosa; Nasal Swab  Result Value Ref Range Status   MRSA, PCR NEGATIVE NEGATIVE Final   Staphylococcus aureus NEGATIVE NEGATIVE Final    Comment: (NOTE) The Xpert SA Assay (FDA approved for NASAL specimens in patients 6 years of age and older), is one component of a comprehensive surveillance program. It is not intended to diagnose infection nor to guide or monitor treatment. Performed at [facility], [address], [place], [place] [address]      Radiology Studies: No results found.   Nilda Fendt, MD, PhD [facility]  Between 7 am - 7 pm I am available, please contact me via Amion (for emergencies) or Securechat (non urgent messages)  Between 7 pm - 7 am I am not available, please contact night coverage MD/APP via Amion

## 2024-05-29 NOTE — Plan of Care (Signed)

## 2024-05-29 NOTE — Progress Notes (Signed)
 Occupational Therapy Treatment Patient Details Name: Theresa Day MRN: [id number] DOB: Aug 29, 1929 Today's Date: 05/29/2024   History of present illness Theresa Day is a 89 y.o. female admitted 05/19/24 after mechanical fall sustaining closed nondisplaced intertrochanteric fracture of right femur. Family elected for nonoperative management. PMHx: dementia without behavioral disturbance, HTN, HLD.   OT comments  Pt willing to sit EOB and participate in ADLs, had just returned from chair to bed with nursing staff. Pt completed 2 grooming tasks at EOB with max assist for thoroughness and close guard for sitting balance. Pt needs max assist for supine to sit and demonstrates fear of imposed movement and falling. Set pt up to eat a donut and coffee at bed level at end of session. Patient will benefit from continued inpatient follow up therapy, <3 hours/day.      If plan is discharge home, recommend the following:  Two people to help with walking and/or transfers;A lot of help with bathing/dressing/bathroom;Assistance with cooking/housework;Direct supervision/assist for medications management;Direct supervision/assist for financial management;Assist for transportation;Help with stairs or ramp for entrance   Equipment Recommendations  Other (comment) (defer)    Recommendations for Other Services      Precautions / Restrictions Precautions Precautions: Fall Recall of Precautions/Restrictions: Impaired Restrictions Weight Bearing Restrictions Per Provider Order: Yes RLE Weight Bearing Per Provider Order: Non weight bearing       Mobility Bed Mobility Overal bed mobility: Needs Assistance Bed Mobility: Supine to Sit, Sit to Supine     Supine to sit: Max assist Sit to supine: Max assist   General bed mobility comments: assist for all aspects    Transfers                   General transfer comment: pt had recently returned to bed from chair with nursing staff, deferred      Balance Overall balance assessment: Needs assistance Sitting-balance support: Single extremity supported Sitting balance-Leahy Scale: Fair Sitting balance - Comments: Close supervision                                   ADL either performed or assessed with clinical judgement   ADL Overall ADL's : Needs assistance/impaired Eating/Feeding: Minimal assistance;Bed level   Grooming: Maximal assistance;Sitting;Wash/dry hands;Brushing hair Grooming Details (indicate cue type and reason): cleaned pt's fingernails of BM                                    Extremity/Trunk Assessment              Vision       Perception     Praxis     Communication Communication Communication: Impaired Factors Affecting Communication: Hearing impaired   Cognition Arousal: Alert Behavior During Therapy: Anxious Cognition: Cognition impaired   Orientation impairments: Place, Time, Situation Awareness: Intellectual awareness impaired, Online awareness impaired Memory impairment (select all impairments): Short-term memory, Working [employer] fast streamer, Conservation officer, historic buildings Attention impairment (select first level of impairment): Sustained attention Executive functioning impairment (select all impairments): Problem solving, Reasoning, Sequencing, Organization OT - Cognition Comments: unable to recall her birthday                 Following commands: Impaired Following commands impaired: Follows one step commands inconsistently, Follows one step commands with increased time      Cueing   Cueing  Techniques: Verbal cues, Tactile cues, Visual cues, Gestural cues  Exercises      Shoulder Instructions       General Comments      Pertinent Vitals/ Pain       Pain Assessment Pain Assessment: Faces Faces Pain Scale: Hurts even more Pain Location: R hip with bed mobility Pain Descriptors / Indicators: Grimacing, Guarding, Discomfort, Aching, Crying,  Moaning Pain Intervention(s): Premedicated before session, Repositioned  Home Living                                          Prior Functioning/Environment              Frequency  Min 2X/week        Progress Toward Goals  OT Goals(current goals can now be found in the care plan section)  Progress towards OT goals: Goals drowngraded-see care plan  Acute Rehab OT Goals OT Goal Formulation: With patient Time For Goal Achievement: 06/12/24 Potential to Achieve Goals: Fair ADL Goals Pt Will Perform Grooming: with supervision;sitting Pt Will Perform Upper Body Dressing: with min assist;sitting Pt Will Transfer to Toilet: with max assist;squat pivot transfer Additional ADL Goal #1: Pt will complete bed mobility with mod assist in preparation for ADLs.  Plan      Co-evaluation                 AM-PAC OT 6 Clicks Daily Activity     Outcome Measure   Help from another person eating meals?: A Little Help from another person taking care of personal grooming?: A Lot Help from another person toileting, which includes using toliet, bedpan, or urinal?: Total Help from another person bathing (including washing, rinsing, drying)?: A Lot Help from another person to put on and taking off regular upper body clothing?: A Little Help from another person to put on and taking off regular lower body clothing?: Total 6 Click Score: 12    End of Session    OT Visit Diagnosis: Unsteadiness on feet (R26.81);Pain;Muscle weakness (generalized) (M62.81);Other symptoms and signs involving cognitive function Pain - Right/Left: Right Pain - part of body: Hip   Activity Tolerance     Patient Left in bed;with call bell/phone within reach;with bed alarm set   Nurse Communication Other (comment) (secretary aware call button needs replacement)        Time: 8547-8488 OT Time Calculation (min): 19 min  Charges: OT General Charges $OT Visit: 1 Visit OT  Treatments $Self Care/Home Management : 8-22 mins  Mliss HERO, OTR/L Acute Rehabilitation Services Office: [phone]   Kennth Mliss Helling 05/29/2024, 3:26 PM

## 2024-05-30 DIAGNOSIS — Z515 Encounter for palliative care: Secondary | ICD-10-CM | POA: Diagnosis not present

## 2024-05-30 DIAGNOSIS — Z7189 Other specified counseling: Secondary | ICD-10-CM | POA: Diagnosis not present

## 2024-05-30 LAB — CBC
HCT: 32.6 % — ABNORMAL LOW (ref 36.0–46.0)
Hemoglobin: 10.7 g/dL — ABNORMAL LOW (ref 12.0–15.0)
MCH: 31.2 pg (ref 26.0–34.0)
MCHC: 32.8 g/dL (ref 30.0–36.0)
MCV: 95 fL (ref 80.0–100.0)
Platelets: 293 K/uL (ref 150–400)
RBC: 3.43 MIL/uL — ABNORMAL LOW (ref 3.87–5.11)
RDW: 13.8 % (ref 11.5–15.5)
WBC: 5.9 K/uL (ref 4.0–10.5)
nRBC: 0 % (ref 0.0–0.2)

## 2024-05-30 LAB — COMPREHENSIVE METABOLIC PANEL WITH GFR
ALT: 19 U/L (ref 0–44)
AST: 24 U/L (ref 15–41)
Albumin: 2.3 g/dL — ABNORMAL LOW (ref 3.5–5.0)
Alkaline Phosphatase: 93 U/L (ref 38–126)
Anion gap: 9 (ref 5–15)
BUN: 16 mg/dL (ref 8–23)
CO2: 26 mmol/L (ref 22–32)
Calcium: 9.4 mg/dL (ref 8.9–10.3)
Chloride: 101 mmol/L (ref 98–111)
Creatinine, Ser: 0.53 mg/dL (ref 0.44–1.00)
GFR, Estimated: >60 mL/min (ref >60)
Glucose, Bld: 80 mg/dL (ref 70–99)
Potassium: 3.7 mmol/L (ref 3.5–5.1)
Sodium: 136 mmol/L (ref 135–145)
Total Bilirubin: 0.5 mg/dL (ref 0.0–1.2)
Total Protein: 5.4 g/dL — ABNORMAL LOW (ref 6.5–8.1)

## 2024-05-30 LAB — GLUCOSE, CAPILLARY
Glucose-Capillary: 133 mg/dL — ABNORMAL HIGH (ref 70–99)
Glucose-Capillary: 122 mg/dL — ABNORMAL HIGH (ref 70–99)

## 2024-05-30 LAB — MAGNESIUM: Magnesium: 1.9 mg/dL (ref 1.7–2.4)

## 2024-05-30 NOTE — Progress Notes (Signed)
 PROGRESS NOTE  Theresa Day FMW:[id number] DOB: 23-Feb-1930 DOA: 05/19/2024 PCP: Theresa Rosina CROME, PA-C   LOS: 11 days   Brief Narrative / Interim history: Ms. Theresa Day is a 89 yo female with PMH dementia, HTN, HLD who presented after a mechanical fall.  Fall had taken place approximately 1 week prior to admission.  She had tripped and fallen on her right knee. Multiple imaging studies performed on admission in the ER.  Ultimately found to have acute right intertrochanteric hip fracture without displacement. Orthopedic surgery consulted on admission.    Subjective / 24h Interval events: Confused, no specific complaints  Assesement and Plan: Principal problem Closed nondisplaced intertrochanteric fracture of right femur -on MRI on admission showed acute right intertrochanteric hip fracture without displacement, with surrounding marrow and regional soft tissue edema. Orthopedic surgery consulted, recommending surgery. Currently nonweightbearing.  Patient does not want surgery however she has underlying dementia and no capacity to make medical decisions.  Waiting legal guardian input   Active problems Preoperative medical evaluation - [name] Estimated Risk Probability for Perioperative Myocardial Infarction or Cardiac Arrest: 0.5 %.  Would not recommend any additional workup at this point   Dementia without behavioral disturbance -stable mood, cooperative   Hyperlipidemia - Noted.  Advanced age and no mortality benefit with treatment   Essential hypertension -on no medications, blood pressure normalized today   Severe protein calorie malnutrition - Body mass index is 15.28 kg/m. Concern for neglect.  Currently with [facility].   Bilateral buttock injury - Deep tissue injury present on admission.  Continue foam dressing.   Social issues - Patient lives with grandson, and she recently lost her husband.  [facility] involved he has guardianship for medical decision making  Depression - Patient  reported depression, no appetite.  Started on Remeron   Right ankle and knee pain - Patient reports right anterior knee pain. X-ray ankle shows evidence of soft tissue swelling. X-ray knee performed earlier in the admission shows no fracture.  There was concern for gout, colchicine was started.  Improving   Goals of care conversation - Patient has been reporting that she does not want any surgery to multiple people.  Awaiting clarity from legal guardian.  Palliative care consulted as well  Scheduled Meds:  acetaminophen   1,000 mg Oral TID   Chlorhexidine Gluconate Cloth  6 each Topical Daily   cholecalciferol  1,000 Units Oral Daily   colchicine  0.6 mg Oral Daily   feeding supplement  237 mL Oral BID BM   leptospermum manuka honey  1 Application Topical Daily   mirtazapine  7.5 mg Oral QHS   multivitamin with minerals  1 tablet Oral Daily   senna  1 tablet Oral QHS   Vitamin D (Ergocalciferol)  50,000 Units Oral Q7 days   Continuous Infusions: PRN Meds:.morphine injection, oxyCODONE   Current Outpatient Medications  Medication Instructions   acetaminophen  (TYLENOL ) 500-1,000 mg, Oral, Every 6 hours PRN   Morphine Sulfate (MORPHINE CONCENTRATE) 10 mg / 0.5 ml concentrated solution 0.25 mLs, Oral, 4 times daily PRN    Diet Orders (From admission, onward)     Start     Ordered   05/26/24 0843  DIET DYS 3 Room service appropriate? Yes; Fluid consistency: Thin  Diet effective now       Question Answer Comment  Room service appropriate? Yes   Fluid consistency: Thin      05/26/24 0842            DVT prophylaxis:  SCDs Start: 05/20/24 0415   Lab Results  Component Value Date   PLT 293 05/30/2024      Code Status: Limited: Do not attempt resuscitation (DNR) -DNR-LIMITED -Do Not Intubate/DNI   Family Communication: no family at bedside   Status is: Inpatient Remains inpatient appropriate because: Severity of illness  Level of care: Med-Surg  Consultants:  Orthopedic  surgery Palliative care  Objective: Vitals:   05/29/24 1441 05/29/24 1943 05/30/24 0518 05/30/24 0827  BP: 124/70 (!) 111/58 (!) 148/63 127/68  Pulse: 97 66 72 78  Resp: 16 16 16 17   Temp: 98.2 F (36.8 C) 98.7 F (37.1 C) 97.9 F (36.6 C) 97.8 F (36.6 C)  TempSrc:  Oral  Oral  SpO2: 100% 98% 100% 97%  Weight:      Height:        Intake/Output Summary (Last 24 hours) at 05/30/2024 1158 Last data filed at 05/30/2024 1100 Gross per 24 hour  Intake 250 ml  Output 350 ml  Net -100 ml   Wt Readings from Last 3 Encounters:  05/20/24 35.5 kg  11/07/21 47.6 kg  08/18/21 56.7 kg    Examination:  Constitutional: NAD Eyes: lids and conjunctivae normal, no scleral icterus ENMT: mmm Neck: normal, supple Respiratory: clear to auscultation bilaterally, no wheezing, no crackles.  Cardiovascular: Regular rate and rhythm, no murmurs / rubs / gallops. No LE edema. Abdomen: soft, no distention, no tenderness. Bowel sounds positive.   Data Reviewed: I have independently reviewed following labs and imaging studies   CBC Recent Labs  Lab 05/25/24 0515 05/26/24 0702 05/27/24 0501 05/28/24 0445 05/30/24 0347  WBC 7.6 8.2 7.3 5.5 5.9  HGB 12.7 11.4* 11.4* 11.0* 10.7*  HCT 38.9 35.1* 34.8* 33.0* 32.6*  PLT 318 326 293 282 293  MCV 97.7 95.9 96.7 95.1 95.0  MCH 31.9 31.1 31.7 31.7 31.2  MCHC 32.6 32.5 32.8 33.3 32.8  RDW 13.3 13.4 13.5 13.7 13.8    Recent Labs  Lab 05/25/24 0515 05/26/24 0702 05/26/24 1419 05/27/24 0501 05/28/24 0445 05/29/24 0327 05/30/24 0347  NA 136 136  --  137 134* 139 136  K 4.7 4.1  --  4.1 3.9 3.7 3.7  CL 103 103  --  103 101 104 101  CO2 25 26  --  27 25 23 26   GLUCOSE 69* 92  --  93 97 103* 80  BUN 24* 24*  --  23 20 20 16   CREATININE 0.61 0.62  --  0.59 0.53 0.57 0.53  CALCIUM 9.9 9.6  --  9.7 9.3 9.5 9.4  AST  --   --   --   --   --   --  24  ALT  --   --   --   --   --   --  19  ALKPHOS  --   --   --   --   --   --  93  BILITOT  --    --   --   --   --   --  0.5  ALBUMIN  --   --   --   --   --   --  2.3*  MG 2.2 1.9  --  2.2 1.9  --  1.9  CRP  --   --  0.5  --   --   --   --     ------------------------------------------------------------------------------------------------------------------ No results for input(s): CHOL, HDL, LDLCALC, TRIG, CHOLHDL, LDLDIRECT in the last  72 hours.  Lab Results  Component Value Date   HGBA1C 5.8 (H) 05/19/2024   ------------------------------------------------------------------------------------------------------------------ No results for input(s): TSH, T4TOTAL, T3FREE, THYROIDAB in the last 72 hours.  Invalid input(s): FREET3  Cardiac Enzymes No results for input(s): CKMB, TROPONINI, MYOGLOBIN in the last 168 hours.  Invalid input(s): CK ------------------------------------------------------------------------------------------------------------------ No results found for: BNP  CBG: Recent Labs  Lab 05/28/24 0620 05/28/24 1211 05/28/24 1645 05/29/24 0628 05/29/24 1645  GLUCAP 107* 159* 102* 89 127*    No results found for this or any previous visit (from the past 240 hours).    Radiology Studies: No results found.   Nilda Fendt, MD, PhD [facility]  Between 7 am - 7 pm I am available, please contact me via Amion (for emergencies) or Securechat (non urgent messages)  Between 7 pm - 7 am I am not available, please contact night coverage MD/APP via Amion

## 2024-05-30 NOTE — TOC Progression Note (Signed)
 Transition of Care [facility]) - Progression Note    Patient Details  Name: Theresa Day MRN: [id number] Date of Birth: May 10, 1930  Transition of Care [facility]) CM/SW Contact  Bridget Cordella Simmonds, LCSW Phone Number: 05/30/2024, 12:43 PM  Clinical Narrative:   CSW spoke with Ms [name]/APS: she has spoken to the [facility], they have not come to speak with the pt yet but are planning to do so this weekend.  APS decision continues to be on hold until this is completed.     Expected Discharge Plan: Skilled Nursing Facility Barriers to Discharge: Continued Medical Work up, SNF Pending bed offer, Other (must enter comment) (open APS case)               Expected Discharge Plan and Services In-house Referral: Clinical Social Work     Living arrangements for the past 2 months: Single Family Home                                       Social Drivers of Health (SDOH) Interventions SDOH Screenings   Food Insecurity: Patient Unable To Answer (05/21/2024)  Housing: Unknown (05/21/2024)  Transportation Needs: No Transportation Needs (05/21/2024)  Utilities: Patient Unable To Answer (05/21/2024)  Financial Resource Strain: High Risk (05/15/2023)   Received from [facility]  Physical Activity: Inactive (06/29/2021)   Received from [facility]  Social Connections: Patient Unable To Answer (05/21/2024)  Stress: No Stress Concern Present (06/29/2021)   Received from [facility]  Tobacco Use: Low Risk  (05/21/2024)    Readmission Risk Interventions     No data to display

## 2024-05-31 ENCOUNTER — Inpatient Hospital Stay (HOSPITAL_COMMUNITY)

## 2024-05-31 DIAGNOSIS — S72144D Nondisplaced intertrochanteric fracture of right femur, subsequent encounter for closed fracture with routine healing: Secondary | ICD-10-CM | POA: Diagnosis not present

## 2024-05-31 LAB — GLUCOSE, CAPILLARY
Glucose-Capillary: 79 mg/dL (ref 70–99)
Glucose-Capillary: 124 mg/dL — ABNORMAL HIGH (ref 70–99)
Glucose-Capillary: 107 mg/dL — ABNORMAL HIGH (ref 70–99)

## 2024-05-31 MED ORDER — ORAL CARE MOUTH RINSE: 15.0000 mL | OROMUCOSAL | Status: DC | PRN
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
## 2024-05-31 NOTE — Plan of Care (Signed)
  Problem: Coping: Goal: Ability to adjust to condition or change in health will improve Outcome: Progressing   Problem: Nutritional: Goal: Maintenance of adequate nutrition will improve Outcome: Progressing   Problem: Nutritional: Goal: Progress toward achieving an optimal weight will improve Outcome: Progressing

## 2024-05-31 NOTE — Progress Notes (Addendum)
 Orthopedic Note  I saw the patient this morning.  She was eating breakfast.  There is no family at bedside.  She is not having much pain in her hip this morning.  EHL/TA/GSC intact.  Sensation intact to light touch in sural/saphenous/deep peroneal/superficial peroneal/tibial nerve distributions.   Patient has now been immobilized for 3 weeks since her time of injury.  The patient and grandson did not want any kind of surgical intervention which initially delayed surgery.  Decision making capacity was turned over to a guardian and the guardian has not rendered a decision about how to proceed for this patient.  One of the main benefits of the fracture fixation is early mobilization.  This has been shown to have a benefit in terms of mortality.  However, we are well past the timeframe for typical fracture fixation.  I am not confident of the benefit of early mobilization is still present.  The only remaining benefit would be to prevent displacement.  I am going to get a new x-ray today.  If it has not displaced, I will continue with nonoperative management since it is not displacing with time.  She will be nonweightbearing on the lower extremity for 3 more weeks.  After that time, she can weight-bear as tolerated.  I will continue to follow her in the office to monitor the fracture.   Ozell DELENA Ada, MD Orthopedic surgeon

## 2024-06-01 DIAGNOSIS — S72144D Nondisplaced intertrochanteric fracture of right femur, subsequent encounter for closed fracture with routine healing: Secondary | ICD-10-CM | POA: Diagnosis not present

## 2024-06-01 LAB — GLUCOSE, CAPILLARY
Glucose-Capillary: 82 mg/dL (ref 70–99)
Glucose-Capillary: 137 mg/dL — ABNORMAL HIGH (ref 70–99)
Glucose-Capillary: 136 mg/dL — ABNORMAL HIGH (ref 70–99)
Glucose-Capillary: 138 mg/dL — ABNORMAL HIGH (ref 70–99)

## 2024-06-01 MED ORDER — MIRTAZAPINE 7.5 MG PO TABS: 7.5000 mg | ORAL_TABLET | Freq: Every day | ORAL | Status: AC

## 2024-06-01 MED ORDER — MORPHINE SULFATE (CONCENTRATE) 10 MG /0.5 ML PO SOLN: 5.0000 mg | Freq: Four times a day (QID) | ORAL | 0 refills | Status: DC | PRN

## 2024-06-01 MED ORDER — VITAMIN D (ERGOCALCIFEROL) 1.25 MG (50000 UNIT) PO CAPS: 50000.0000 [IU] | ORAL_CAPSULE | ORAL | Status: AC
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
## 2024-06-01 NOTE — Progress Notes (Signed)
   Palliative Medicine Inpatient Follow Up Note HPI: Theresa Day is a 89 yo female with PMH dementia, HTN, HLD who presented after a mechanical fall.  Fall had taken place approximately 1 week prior to admission.  She had tripped and fallen on her right knee. Found to have acute right intertrochanteric hip fracture without displacement. Orthopedic surgery consulted and have explained the potential risks of operative intervention to patients family. The Palliative medicine team has been asked to support additional goals of care conversations.   Today's Discussion 06/01/2024  *Please note that this is a verbal dictation therefore any spelling or grammatical errors are due to the Dragon Medical One system interpretation.  Chart reviewed inclusive of vital signs, progress notes, laboratory results, and diagnostic images.   Orthopaedic notes reviewed, no plan for intervention at this time.   I met with Theresa Day at bedside this afternoon. She is awake and oriented to self. She shares with me that she does not feel like she is in pain this afternoon. She is pleasant. She shares she has a decreased appetite and is generally not feeling hungry. Have encouraged to eat and drink.   Questions and concerns addressed/Palliative Support Provided.   Objective Assessment: Vital Signs Vitals:   06/01/24 0447 06/01/24 1402  BP: 134/75 121/74  Pulse: 68 86  Resp: 16 16  Temp: 98.2 F (36.8 C) 98 F (36.7 C)  SpO2: 99% 97%    Intake/Output Summary (Last 24 hours) at 06/01/2024 1444 Last data filed at 05/31/2024 2343 Gross per 24 hour  Intake --  Output 450 ml  Net -450 ml   Last Weight  Most recent update: 05/20/2024  6:13 AM    Weight  35.5 kg (78 lb 4.2 oz)            Gen:  Elderly Caucasian F chronically ill appearing HEENT: Dry mucous membranes CV: Regular rate and rhythm  PULM: On RA, breathing is even and nonlabored   ABD: soft/nontender  EXT: No edema  Neuro: Alert and oriented  to self  SUMMARY OF RECOMMENDATIONS   DNAR/DNI  Desire for a natural death has been placed in chart (media section)   Complicated social situation - has been appointed a guardian through APS   Pain: Continue tylenol  ATC, morphine as needed, oxycodone  as needed  Constipation: senna 1 tab at bedtime  Adult FTT - Nutrition consult  The PMT will provide ongoing support as needed ______________________________________________________________________________________ Theresa Day [facility] Palliative Medicine Team Team Cell Phone: [phone] Please utilize secure chat with additional questions, if there is no response within 30 minutes please call the above phone number  Time: 74  Palliative Medicine Team providers are available by phone from 7am to 7pm daily and can be reached through the team cell phone.  Should this patient require assistance outside of these hours, please call the patient's attending physician.

## 2024-06-01 NOTE — Plan of Care (Signed)
  Problem: Education: Goal: Ability to describe self-care measures that may prevent or decrease complications (Diabetes Survival Skills Education) will improve Outcome: Progressing Goal: Individualized Educational Video(s) Outcome: Progressing   Problem: Coping: Goal: Ability to adjust to condition or change in health will improve Outcome: Progressing   Problem: Fluid Volume: Goal: Ability to maintain a balanced intake and output will improve Outcome: Progressing   Problem: Tissue Perfusion: Goal: Adequacy of tissue perfusion will improve Outcome: Progressing

## 2024-06-02 DIAGNOSIS — S72144D Nondisplaced intertrochanteric fracture of right femur, subsequent encounter for closed fracture with routine healing: Secondary | ICD-10-CM | POA: Diagnosis not present

## 2024-06-02 LAB — GLUCOSE, CAPILLARY
Glucose-Capillary: 80 mg/dL (ref 70–99)
Glucose-Capillary: 140 mg/dL — ABNORMAL HIGH (ref 70–99)

## 2024-06-02 NOTE — TOC Progression Note (Signed)
 Transition of Care [facility]) - Progression Note    Patient Details  Name: Theresa Day MRN: [id number] Date of Birth: 1929-09-02  [facility] of [facility] CM/SW Contact  Bridget Cordella Simmonds, LCSW Phone Number: 06/02/2024, 11:36 AM  Clinical Narrative:   CSW informed by MD that pt no longer candidate for surgery.  Message sent to Ms [name]/APS with this update.  PT recommendation continues to be SNF, referral sent out in hub.     Expected Discharge Plan: Skilled Nursing Facility Barriers to Discharge: Continued Medical Work up, SNF Pending bed offer, Other (must enter comment) (open APS case)               Expected Discharge Plan and Services In-house Referral: Clinical Social Work     Living arrangements for the past 2 months: Single Family Home                                       Social Drivers of Health (SDOH) Interventions SDOH Screenings   Food Insecurity: Patient Unable To Answer (05/21/2024)  Housing: Unknown (05/21/2024)  Transportation Needs: No Transportation Needs (05/21/2024)  Utilities: Patient Unable To Answer (05/21/2024)  Financial Resource Strain: High Risk (05/15/2023)   Received from [facility]  Physical Activity: Inactive (06/29/2021)   Received from [facility]  Social Connections: Patient Unable To Answer (05/21/2024)  Stress: No Stress Concern Present (06/29/2021)   Received from [facility]  Tobacco Use: Low Risk  (05/21/2024)    Readmission Risk Interventions     No data to display
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
## 2024-06-02 NOTE — Progress Notes (Addendum)
   06/02/24 1501  Spiritual Encounters  Type of Visit Initial  Care provided to: Pt and family  Reason for visit Routine spiritual support  OnCall Visit No  Spiritual Framework  Presenting Themes Impactful experiences and emotions  Patient Stress Factors Health changes;Exhausted  Family Stress Factors Health changes  Interventions  Spiritual Care Interventions Made Compassionate presence;Established relationship of care and support;Normalization of emotions  Intervention Outcomes  Outcomes Awareness of support;Awareness of health   Chaplain stop by to visit the Pt. Pt was alert and cognitively engaged at the time of the visit. The Pt's son was present and speaking with the Pt.Chaplain did not want to interrupt their conversation and will return later to follow up.Chaplain returned and offered a word of prayer per the Pt request.

## 2024-06-02 NOTE — Plan of Care (Signed)
  Problem: Education: Goal: Ability to describe self-care measures that may prevent or decrease complications (Diabetes Survival Skills Education) will improve Outcome: Progressing   Problem: Health Behavior/Discharge Planning: Goal: Ability to manage health-related needs will improve Outcome: Progressing   Problem: Metabolic: Goal: Ability to maintain appropriate glucose levels will improve Outcome: Progressing   Problem: Education: Goal: Knowledge of General Education information will improve Description: Including pain rating scale, medication(s)/side effects and non-pharmacologic comfort measures Outcome: Progressing

## 2024-06-02 NOTE — Plan of Care (Signed)

## 2024-06-02 NOTE — Progress Notes (Signed)
 Physical Therapy Treatment Patient Details Name: Theresa Day MRN: [id number] DOB: August 08, 1930 Today's Date: 06/02/2024   History of Present Illness Theresa Day is a 89 y.o. female admitted 05/19/24 after mechanical fall sustaining closed nondisplaced intertrochanteric fracture of right femur. Family elected for nonoperative management. PMHx: dementia without behavioral disturbance, HTN, HLD.    PT Comments  Pt resting in bed on arrival, agreeable to session, however limited as pt with continued difficulty maintaining NWB on R with OOB mobility. Pt performing supine>sit with max A for all aspects. Pt requiring min A +2 to come to stand however pt needing max verbal and tactile cues to maintain NWB on R with pt continuing to try and put weight through and step with RLE. Pt pivoting to L in standing x2 from EOB>BSC>recliner with max A +2 with HHA. Unable to progress gait at this time due to non compliance with weight bearing precautions. Patient will benefit from continued inpatient follow up therapy, <3 hours/day. Pt continues to benefit from skilled PT services to progress toward functional mobility goals.      If plan is discharge home, recommend the following: A lot of help with bathing/dressing/bathroom;Assistance with cooking/housework;Assist for transportation;Help with stairs or ramp for entrance;Supervision due to cognitive status;Direct supervision/assist for medications management;Two people to help with walking and/or transfers   Can travel by private vehicle     No  Equipment Recommendations  Wheelchair (measurements PT);Wheelchair cushion (measurements PT);BSC/3in1;Rolling walker (2 wheels)    Recommendations for Other Services       Precautions / Restrictions Precautions Precautions: Fall Recall of Precautions/Restrictions: Impaired Precaution/Restrictions Comments: Pt does not recall her weight-bearing status and is unable to adhere to it during mobility despite  multi-modal cues. PTA supports under pt's R foot to prevent weight acceptance onto RLE. Restrictions Weight Bearing Restrictions Per Provider Order: Yes RLE Weight Bearing Per Provider Order: Non weight bearing     Mobility  Bed Mobility Overal bed mobility: Needs Assistance Bed Mobility: Supine to Sit     Supine to sit: Max assist     General bed mobility comments: assist for all aspects    Transfers Overall transfer level: Needs assistance Equipment used: 2 person hand held assist Transfers: Sit to/from Stand, Bed to chair/wheelchair/BSC Sit to Stand: Min assist, +2 physical assistance Stand pivot transfers: +2 physical assistance, Max assist         General transfer comment: Pt standing from lowest bed height. She held onto PTA/[employer] upper arm. PTA supported under pt's R foot to limit weight acceptance on RLE in order to maintain NWB. She transferred to [facility] on L positioned touching bed. Pt frequently attempting to take steps and get off of PTA's foot in order to bear down through RLE. Cues for sequencing. Assist to pivot hips and guide pt to surface. She appears to put weight through L toes and will slightly slip.Transferred from BSC>recliner chair on L.    Ambulation/Gait               General Gait Details: Unable to ambulate while maintaining RLE NWB. Pt transferred with 2 HHA and attempted to take steps. PTA maintained support under R foot to prevent weight acceptance.   Stairs             Wheelchair Mobility     Tilt Bed    Modified Rankin (Stroke Patients Only)       Balance Overall balance assessment: Needs assistance Sitting-balance support: Single extremity supported Sitting balance-Leahy  Scale: Fair Sitting balance - Comments: Close supervision Postural control: Posterior lean Standing balance support: Bilateral upper extremity supported, During functional activity, Reliant on assistive device for balance Standing  balance-Leahy Scale: Poor Standing balance comment: Pt dependent on RW and unable to maintain upright posture with RLE NWB.                            Communication Communication Communication: Impaired Factors Affecting Communication: Hearing impaired  Cognition Arousal: Alert Behavior During Therapy: Anxious   PT - Cognitive impairments: History of cognitive impairments, Orientation (Dementia)   Orientation impairments: Place, Time, Situation                   PT - Cognition Comments: Pt appears to anticipate pain and will begin to cry/moan prior to performing any movement. She is fearful of falling and pain increasing. Requires moderate encouragement to participate. Offered reassurement and consolment during mobility with pt participating well. Following commands: Impaired Following commands impaired: Follows one step commands inconsistently, Follows one step commands with increased time    Cueing Cueing Techniques: Verbal cues, Tactile cues, Visual cues, Gestural cues  Exercises      General Comments General comments (skin integrity, edema, etc.): VSS on RA, pt dranddaughter present and supportive      Pertinent Vitals/Pain Pain Assessment Pain Assessment: Faces Faces Pain Scale: Hurts even more Pain Location: R hip with bed mobility Pain Descriptors / Indicators: Grimacing, Guarding, Discomfort, Aching, Crying, Moaning Pain Intervention(s): Limited activity within patient's tolerance, Monitored during session, Repositioned    Home Living                          Prior Function            PT Goals (current goals can now be found in the care plan section) Acute Rehab PT Goals Patient Stated Goal: Feel better and have no pain PT Goal Formulation: Patient unable to participate in goal setting Time For Goal Achievement: 06/04/24 Progress towards PT goals: Not progressing toward goals - comment (pt unable to maintain NWB on R)     Frequency    Min 2X/week      PT Plan      Co-evaluation              AM-PAC PT 6 Clicks Mobility   Outcome Measure  Help needed turning from your back to your side while in a flat bed without using bedrails?: Total Help needed moving from lying on your back to sitting on the side of a flat bed without using bedrails?: Total Help needed moving to and from a bed to a chair (including a wheelchair)?: Total Help needed standing up from a chair using your arms (e.g., wheelchair or bedside chair)?: Total Help needed to walk in hospital room?: Total Help needed climbing 3-5 steps with a railing? : Total 6 Click Score: 6    End of Session Equipment Utilized During Treatment: Gait belt Activity Tolerance: Patient tolerated treatment well Patient left: in chair;with call bell/phone within reach;with chair alarm set;with family/visitor present Nurse Communication: Mobility status PT Visit Diagnosis: Difficulty in walking, not elsewhere classified (R26.2);Other abnormalities of gait and mobility (R26.89);Unsteadiness on feet (R26.81);Pain Pain - Right/Left: Right Pain - part of body: Hip     Time: 8976-8962 PT Time Calculation (min) (ACUTE ONLY): 14 min  Charges:    $Therapeutic Activity: 8-22 mins PT  General Charges $$ ACUTE PT VISIT: 1 Visit                     Therisa R. PTA Acute Rehabilitation Services Office: [phone]   Therisa CHRISTELLA Boor 06/02/2024, 11:17 AM

## 2024-06-02 NOTE — NC FL2 (Signed)
 [place]  MEDICAID FL2 LEVEL OF CARE FORM     IDENTIFICATION  Patient Name: Theresa Day Birthdate: 23-Jul-1930 Sex: female Admission Date (Current Location): 05/19/2024  [facility] and [place] Number:  Producer, television/film/video and Address:  [facility], [address] [place], [place] [address]      Provider Number: [id number]  Attending Physician Name and Address:  Trixie Nilda HERO, MD  Relative Name and Phone Number:  [name],[name] Legal Guardian   [phone]    Current Level of Care: Hospital Recommended Level of Care: Skilled Nursing Facility Prior Approval Number:    Date Approved/Denied:   PASRR Number: [id number]  Discharge Plan: SNF    Current Diagnoses: Patient Active Problem List   Diagnosis Date Noted   Dementia without behavioral disturbance (HCC) 05/20/2024   Protein-calorie malnutrition, severe 05/20/2024   Closed nondisplaced intertrochanteric fracture of right femur (HCC) 05/19/2024   Essential hypertension 05/19/2024   Hyperlipidemia 05/19/2024    Orientation RESPIRATION BLADDER Height & Weight     Self  Normal Incontinent, Indwelling catheter Weight: 78 lb 4.2 oz (35.5 kg) Height:  5' (152.4 cm)  BEHAVIORAL SYMPTOMS/MOOD NEUROLOGICAL BOWEL NUTRITION STATUS      Incontinent Diet (see discharge summary)  AMBULATORY STATUS COMMUNICATION OF NEEDS Skin   Total Care Verbally Surgical wounds, Other (Comment) (loose slough in 3 areas across bilat buttocks/sacrum;)                       Personal Care Assistance Level of Assistance  Bathing, Feeding, Dressing, Total care Bathing Assistance: Maximum assistance Feeding assistance: Limited assistance Dressing Assistance: Maximum assistance Total Care Assistance: Maximum assistance   Functional Limitations Info             SPECIAL CARE FACTORS FREQUENCY  PT (By licensed PT), OT (By licensed OT)     PT Frequency: 5x week OT Frequency: 5x week             Contractures Contractures Info: Not present    Additional Factors Info  Code Status, Allergies Code Status Info: DNR Allergies Info: Atorvastatin, Ezetimibe, Simvastatin, Statins           Current Medications (06/02/2024):  This is the current hospital active medication list Current Facility-Administered Medications  Medication Dose Route Frequency Provider Last Rate Last Admin   acetaminophen  (TYLENOL ) tablet 1,000 mg  1,000 mg Oral TID Ferolito, Michelle Y, NP   1,000 mg at 06/02/24 0930   Chlorhexidine Gluconate Cloth 2 % PADS 6 each  6 each Topical Daily Patel, Pranav M, MD   6 each at 06/02/24 1005   cholecalciferol (VITAMIN D3) 25 MCG (1000 UNIT) tablet 1,000 Units  1,000 Units Oral Daily Patsy Lenis, MD   1,000 Units at 06/02/24 0930   feeding supplement (ENSURE PLUS HIGH PROTEIN) liquid 237 mL  237 mL Oral BID BM Patsy Lenis, MD   237 mL at 06/02/24 1004   leptospermum manuka honey (MEDIHONEY) paste 1 Application  1 Application Topical Daily Tobie Yetta HERO, MD   1 Application at 06/02/24 1000   mirtazapine (REMERON) tablet 7.5 mg  7.5 mg Oral QHS Patel, Pranav M, MD   7.5 mg at 06/01/24 2136   morphine (PF) 2 MG/ML injection 0.5 mg  0.5 mg Intravenous Q2H PRN Patel, Pranav M, MD       multivitamin with minerals tablet 1 tablet  1 tablet Oral Daily Patsy Lenis, MD   1 tablet at 06/02/24 0930  Oral care mouth rinse  15 mL Mouth Rinse PRN Gherghe, Costin M, MD       oxyCODONE  (Oxy IR/ROXICODONE ) immediate release tablet 5 mg  5 mg Oral Q4H PRN Patel, Pranav M, MD   5 mg at 05/30/24 2329   senna (SENOKOT) tablet 8.6 mg  1 tablet Oral QHS Ferolito, Michelle Y, NP   8.6 mg at 06/01/24 2136   Vitamin D (Ergocalciferol) (DRISDOL) 1.25 MG (50000 UNIT) capsule 50,000 Units  50,000 Units Oral Q7 days Patel, Pranav M, MD   50,000 Units at 05/30/24 1518     Discharge Medications: Please see discharge summary for a list of discharge medications.  Relevant Imaging  Results:  Relevant Lab Results:   Additional Information SSN: [id number]  Bridget Cordella Simmonds, LCSW

## 2024-06-03 DIAGNOSIS — S72144D Nondisplaced intertrochanteric fracture of right femur, subsequent encounter for closed fracture with routine healing: Secondary | ICD-10-CM | POA: Diagnosis not present

## 2024-06-03 MED ORDER — MORPHINE SULFATE (CONCENTRATE) 10 MG /0.5 ML PO SOLN: 5.0000 mg | Freq: Four times a day (QID) | ORAL | 0 refills | Status: AC | PRN

## 2024-06-03 NOTE — Progress Notes (Signed)
 Called [facility] at [phone] total of 4 times. Some one answers, says they will transfer me, no one answers. Held 10 minutes twice and 5 minutes once and hung up the 4th time after a minute or two. Will continue to call to give report.

## 2024-06-03 NOTE — Progress Notes (Signed)
 06/03/2024 2:56 PM -----------------------------------------------------------[facility]--------------------------------------------------- D(Data) A(Action) R(response)     Data: Discharge Readiness Assessment EDD  today 06/03/2024    Action: Chart reviewed    Response: Awaiting transport. Per notes [facility] called at 1230.     Kortland Nichols, RN [employer]

## 2024-06-03 NOTE — Progress Notes (Signed)
 Occupational Therapy Treatment Patient Details Name: Theresa Day MRN: [id number] DOB: [birth year] Today's Date: 06/03/2024   History of present illness Theresa Day is a 89 y.o. female admitted 05/19/24 after mechanical fall sustaining closed nondisplaced intertrochanteric fracture of right femur. Family elected for nonoperative management. PMHx: dementia without behavioral disturbance, HTN, HLD.   OT comments  Pt is progressing towards OT goals this session. Pt engaged in bed to recliner transfer with sliding board. Pt required step-by-step verbal cues for sequencing transfer and Min A +2 for safety. Pt with positive response to type of transfer, fear of falling decreased with explanation of lateral scoot. Pt required Min A management of RLE to maintain NWB precautions. Pt required Mod A bed mobility supine to sit EOB. Pt continues to require up to total A for ADL engagement. Pt continues to be limited by pain, NWB precautions, anxiety, and impaired cognition. OT to continue per POC. Patient will benefit from continued inpatient follow up therapy, <3 hours/day.        If plan is discharge home, recommend the following:  Two people to help with walking and/or transfers;A lot of help with bathing/dressing/bathroom;Assistance with cooking/housework;Direct supervision/assist for medications management;Direct supervision/assist for financial management;Assist for transportation;Help with stairs or ramp for entrance   Equipment Recommendations  None recommended by OT    Recommendations for Other Services      Precautions / Restrictions Precautions Precautions: Fall Recall of Precautions/Restrictions: Impaired Precaution/Restrictions Comments: Pt does not recall weight bearing status, requires verbal and gestural cues to maintain precautions. Restrictions Weight Bearing Restrictions Per Provider Order: Yes RLE Weight Bearing Per Provider Order: Non weight bearing       Mobility Bed  Mobility Overal bed mobility: Needs Assistance Bed Mobility: Supine to Sit     Supine to sit: Mod assist, HOB elevated     General bed mobility comments: Mod hand-held A to elevate trunk and maintain balance. Pt able to manage BLE with increased time and dense multimodal cues. Pt utilized therapist hands to pull herself forward when scooting towards EOB.    Transfers Overall transfer level: Needs assistance Equipment used: Sliding board Transfers: Bed to chair/wheelchair/BSC            Lateral/Scoot Transfers: Min assist, +2 safety/equipment, With slide board General transfer comment: Pt with positive response to sliding board transfer from bed to recliner. Pt required step by step cues and support of RLE to maintain NWB precautions. Pt demonstrated good trunk control and BUE strength for transfer.     Balance Overall balance assessment: Needs assistance Sitting-balance support: Bilateral upper extremity supported, Feet supported Sitting balance-Leahy Scale: Fair Sitting balance - Comments: Close supervision Postural control: Posterior lean                                 ADL either performed or assessed with clinical judgement   ADL Overall ADL's : Needs assistance/impaired Eating/Feeding: Minimal assistance;Sitting Eating/Feeding Details (indicate cue type and reason): Assist to set up tray table and open containers. Assistance needed to retreive utensils. Encouragement needed to eat.                     Toilet Transfer: Minimal assistance;+2 for safety/equipment;Transfer board   Toileting- Clothing Manipulation and Hygiene: Total assistance       Functional mobility during ADLs: Minimal assistance;+2 for safety/equipment General ADL Comments: Pt initially transfer to [facility], opting to transfer to  recliner d/t fear of falling and pain on BSC. Pt with catheter.    Extremity/Trunk Assessment Upper Extremity Assessment Upper Extremity Assessment:  Generalized weakness            Vision   Additional Comments: Pt glasses on tray table. Therapist encouraged Pt to wear glasses but Pt declined.   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 Pt asking therapist why her family member has not been here to see her. Pt with impaired safety awareness.                 Following commands: Impaired Following commands impaired: Follows one step commands with increased time, Follows multi-step commands inconsistently      Cueing   Cueing Techniques: Verbal cues, Gestural cues, Tactile cues, Visual cues  Exercises      Shoulder Instructions       General Comments Pt with increased anxiety and fear of falling, responded positively to vebal encouragement and physical support. Therapeutic use of self utilized to facilitate treatment session engagement.    Pertinent Vitals/ Pain       Pain Assessment Pain Assessment: PAINAD Breathing: occasional labored breathing, short period of hyperventilation Negative Vocalization: occasional moan/groan, low speech, negative/disapproving quality Facial Expression: smiling or inexpressive Body Language: tense, distressed pacing, fidgeting Consolability: distracted or reassured by voice/touch PAINAD Score: 4 Pain Location: R hip Pain Descriptors /  Indicators: Crying, Grimacing, Guarding, Moaning Pain Intervention(s): Monitored during session, Limited activity within patient's tolerance  Home Living                                          Prior Functioning/Environment              Frequency  Min 2X/week        Progress Toward Goals  OT Goals(current goals can now be found in the care plan section)  Progress towards OT goals: Progressing toward goals  Acute Rehab OT Goals OT Goal Formulation: With patient Time For Goal Achievement: 06/12/24 Potential to Achieve Goals: Fair ADL Goals Pt Will Perform Grooming: with supervision;sitting Pt Will Perform Upper Body Dressing: with min assist;sitting Pt Will Transfer to Toilet: with max assist;with transfer board;bedside commode Pt Will Perform Toileting - Clothing Manipulation and hygiene: with max assist;sitting/lateral leans Additional ADL Goal #1: Pt will complete bed mobility with mod assist in preparation for ADLs.  Plan      Co-evaluation                 AM-PAC OT 6 Clicks Daily Activity     Outcome Measure   Help from another person eating meals?: A Little Help from another person taking care of personal grooming?: A Lot Help from another person toileting, which includes using toliet, bedpan, or urinal?: Total Help from another person bathing (including washing, rinsing, drying)?: A Lot Help from another person to put on and taking off regular upper body clothing?: A  Little Help from another person to put on and taking off regular lower body clothing?: Total 6 Click Score: 12    End of Session Equipment Utilized During Treatment: Other (comment) (sliding board)  OT Visit Diagnosis: Unsteadiness on feet (R26.81);Pain;Muscle weakness (generalized) (M62.81);Other symptoms and signs involving cognitive function Pain - Right/Left: Right Pain - part of body: Hip   Activity Tolerance Patient limited by pain;Other (comment) (limited by  fear of falling)   Patient Left in chair;with chair alarm set;with call bell/phone within reach   Nurse Communication Mobility status        Time: 1305-1330 OT Time Calculation (min): 25 min  Charges: OT General Charges $OT Visit: 1 Visit OT Treatments $Therapeutic Activity: 23-37 mins  Maurilio CROME, OTR/L.  [facility] Acute Rehabilitation  Office: [phone]   Maurilio PARAS Charisse Wendell 06/03/2024, 2:01 PM

## 2024-06-03 NOTE — Progress Notes (Signed)
 Patient hasn't had any out documented since 1400 bladder scan done records she does have a foley

## 2024-06-03 NOTE — Plan of Care (Signed)
  Problem: Coping: Goal: Ability to adjust to condition or change in health will improve Outcome: Progressing   Problem: Fluid Volume: Goal: Ability to maintain a balanced intake and output will improve Outcome: Progressing   Problem: Health Behavior/Discharge Planning: Goal: Ability to identify and utilize available resources and services will improve Outcome: Progressing Goal: Ability to manage health-related needs will improve Outcome: Progressing   Problem: Metabolic: Goal: Ability to maintain appropriate glucose levels will improve Outcome: Progressing   Problem: Nutritional: Goal: Maintenance of adequate nutrition will improve Outcome: Not Progressing Goal: Progress toward achieving an optimal weight will improve Outcome: Not Progressing   Problem: Skin Integrity: Goal: Risk for impaired skin integrity will decrease Outcome: Progressing   Problem: Tissue Perfusion: Goal: Adequacy of tissue perfusion will improve Outcome: Progressing

## 2024-06-03 NOTE — Discharge Planning (Signed)
 Patient alert. IV access removed. Discharge teaching given to Vickie Summer, Charity fundraiser at [employer]. Discharge summary placed in discharge packet. Patient will be transported by [facility].
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
## 2024-06-03 NOTE — TOC Transition Note (Signed)
 Transition of Care [facility]) - Discharge Note   Patient Details  Name: Theresa Day MRN: [id number] Date of Birth: 05/10/1930  [facility]) CM/SW Contact:  Bridget Cordella Simmonds, LCSW Phone Number: 06/03/2024, 12:31 PM   Clinical Narrative:   Pt discharging to [facility].  RN call report to [phone].  PTAR called 1230.     Final next level of care: Skilled Nursing Facility Barriers to Discharge: Barriers Resolved   Patient Goals and CMS Choice            Discharge Placement              Patient chooses bed at: [facility] Patient to be transferred to facility by: ptar Name of family member notified: [facility], [name] Patient and family notified of of transfer: 06/03/24  Discharge Plan and Services Additional resources added to the After Visit Summary for   In-house Referral: Clinical Social Work                                   Social Drivers of Health (SDOH) Interventions SDOH Screenings   Food Insecurity: Patient Unable To Answer (05/21/2024)  Housing: Unknown (05/21/2024)  Transportation Needs: No Transportation Needs (05/21/2024)  Utilities: Patient Unable To Answer (05/21/2024)  Financial Resource Strain: High Risk (05/15/2023)   Received from [facility]  Physical Activity: Inactive (06/29/2021)   Received from [facility]  Social Connections: Patient Unable To Answer (05/21/2024)  Stress: No Stress Concern Present (06/29/2021)   Received from [facility]  Tobacco Use: Low Risk  (05/21/2024)     Readmission Risk Interventions     No data to display

## 2024-06-03 NOTE — TOC Progression Note (Addendum)
 Transition of Care [facility]) - Progression Note    Patient Details  Name: Theresa Day MRN: [id number] Date of Birth: 1929-12-10  Transition [facility]) CM/SW Contact  Bridget Cordella Simmonds, LCSW Phone Number: 06/03/2024, 8:50 AM  Clinical Narrative:   TC [name]: CSW provided bed offers, she will discuss on their end and call back with SNF choice.  They are in agreement with DC plan of SNF.  She is then available for any needed admission paperwork.    1000: TC [name]/APS: they want pt to DC to [place].  CSW confirmed with [facility] and she will email admission paperwork to APS.  1015: SNF auth request submitted in [employer] and approved: [id number], 3 days: 10/14-10/16.  1030: message [facility]: she has already spoken with [name] regarding paperwork and is all set.  MD informed.  Expected Discharge Plan: Skilled Nursing Facility Barriers to Discharge: Continued Medical Work up, SNF Pending bed offer, Other (must enter comment) (open APS case)               Expected Discharge Plan and Services In-house Referral: Clinical Social Work     Living arrangements for the past 2 months: Single Family Home                                       Social Drivers of Health (SDOH) Interventions SDOH Screenings   Food Insecurity: Patient Unable To Answer (05/21/2024)  Housing: Unknown (05/21/2024)  Transportation Needs: No Transportation Needs (05/21/2024)  Utilities: Patient Unable To Answer (05/21/2024)  Financial Resource Strain: High Risk (05/15/2023)   Received from [facility]  Physical Activity: Inactive (06/29/2021)   Received from [facility]  Social Connections: Patient Unable To Answer (05/21/2024)  Stress: No Stress Concern Present (06/29/2021)   Received from [facility]  Tobacco Use: Low Risk  (05/21/2024)    Readmission Risk Interventions     No data to display

## 2024-06-05 ENCOUNTER — Ambulatory Visit: Admitting: Orthopedic Surgery

## 2024-06-23 ENCOUNTER — Encounter: Payer: Self-pay | Admitting: Radiology

## 2024-07-03 ENCOUNTER — Ambulatory Visit: Admitting: Orthopedic Surgery

## 2024-07-03 ENCOUNTER — Other Ambulatory Visit: Payer: Self-pay

## 2024-07-03 DIAGNOSIS — S72144D Nondisplaced intertrochanteric fracture of right femur, subsequent encounter for closed fracture with routine healing: Secondary | ICD-10-CM

## 2024-07-03 NOTE — Progress Notes (Signed)
 Orthopedic Surgery Progress Note   Assessment: Patient is a 89 y.o. female with right intertochanteric femur fracture that has been treated non-operatively   Plan: -Patient did not have any pain with exam today and her injury is over 2 months old, so she can transition to weightbearing as tolerated with a walker.  She can work with physical therapy. -Patient should return to the office in 6 weeks, x-rays at next visit: Right hip AP/lateral  ___________________________________________________________________________  Subjective: Patient was previously seen in the hospital for a right intertrochanteric femur fracture.  She comes in today for routine follow-up.  To recap, her intertrochanteric femur fracture was seen on the MRI that was obtained at the hospital.  Surgery was recommended but patient did not have capacity.  After many days, a guardian was appointed to help with decision-making.  Since it has been several weeks since her injury, the benefit of early mobilization was no longer applicable.  So, I changed my recommendation to nonoperative treatment.  She was discharged to a SNF and has been there since that time. She says that she is not having any hip pain at this point.  However, she thinks that she is at an appointment for her eyes today.   Physical Exam:  General: no acute distress, appears stated age Neurologic: alert, answering questions appropriately, following commands Respiratory: unlabored breathing on room air, symmetric chest rise  MSK:    -Right lower extremity  No pain with logroll, negative FADIR, negative FABER, negative Stinchfield EHL/TA/GSC intact, fires hip flexors, fires knee extensors Plantarflexes and dorsiflexes toes Sensation intact to light touch in sural, saphenous, tibial, deep peroneal, and superficial peroneal nerve distributions Foot warm and well perfused   Imaging:  XRs of the right hip from 07/03/2024 were independently reviewed and  interpreted, showing a nondisplaced greater trochanter fracture.  No extension into the intertrochanteric region visualized on today's x-ray.  No dislocation seen.  No other fracture seen.   Patient name: GENELLA BAS Patient MRN: [id number] Date: 07/03/24

## 2024-07-04 ENCOUNTER — Telehealth: Payer: Self-pay | Admitting: Orthopedic Surgery

## 2024-07-04 NOTE — Telephone Encounter (Signed)
 I called and advised Theresa Day per OV note she is WTBAT with walker. She requested OV note be sent to them via fax.

## 2024-07-04 NOTE — Telephone Encounter (Signed)
 Kala from [facility] called wanting to know the weight bearing status of pt. Call back number is [phone]

## 2024-09-03 ENCOUNTER — Ambulatory Visit: Admitting: Orthopedic Surgery
